# Patient Record
Sex: Female | Born: 1935 | Race: Black or African American | Hispanic: No | Marital: Married | Smoking: Never smoker
Health system: Southern US, Community
[De-identification: ages and names within clinical notes are randomized; demographics above are authoritative.]

## PROBLEM LIST (undated history)

## (undated) DIAGNOSIS — F0151 Vascular dementia with behavioral disturbance: Secondary | ICD-10-CM

## (undated) DIAGNOSIS — I2510 Atherosclerotic heart disease of native coronary artery without angina pectoris: Secondary | ICD-10-CM

## (undated) DIAGNOSIS — F32A Depression, unspecified: Secondary | ICD-10-CM

## (undated) DIAGNOSIS — G459 Transient cerebral ischemic attack, unspecified: Secondary | ICD-10-CM

## (undated) DIAGNOSIS — M1990 Unspecified osteoarthritis, unspecified site: Secondary | ICD-10-CM

## (undated) DIAGNOSIS — F0150 Vascular dementia without behavioral disturbance: Secondary | ICD-10-CM

## (undated) DIAGNOSIS — F0153 Vascular dementia, unspecified severity, with mood disturbance: Secondary | ICD-10-CM

## (undated) DIAGNOSIS — F329 Major depressive disorder, single episode, unspecified: Principal | ICD-10-CM

## (undated) DIAGNOSIS — E7800 Pure hypercholesterolemia, unspecified: Secondary | ICD-10-CM

## (undated) HISTORY — DX: Unspecified osteoarthritis, unspecified site: M19.90

## (undated) HISTORY — DX: Vascular dementia with behavioral disturbance: F01.51

## (undated) HISTORY — DX: Vascular dementia without behavioral disturbance: F01.50

## (undated) HISTORY — DX: Major depressive disorder, single episode, unspecified: F32.9

## (undated) HISTORY — DX: Depression, unspecified: F32.A

## (undated) HISTORY — PX: CORONARY ANGIOPLASTY WITH STENT PLACEMENT: SHX49

## (undated) HISTORY — DX: Vascular dementia, unspecified severity, with mood disturbance: F01.53

---

## 1997-06-10 ENCOUNTER — Ambulatory Visit (HOSPITAL_COMMUNITY): Admission: RE | Admit: 1997-06-10 | Discharge: 1997-06-10 | Payer: Self-pay | Admitting: Obstetrics & Gynecology

## 1997-06-12 ENCOUNTER — Other Ambulatory Visit: Admission: RE | Admit: 1997-06-12 | Discharge: 1997-06-12 | Payer: Self-pay | Admitting: *Deleted

## 1997-08-14 ENCOUNTER — Other Ambulatory Visit: Admission: RE | Admit: 1997-08-14 | Discharge: 1997-08-14 | Payer: Self-pay | Admitting: *Deleted

## 1998-08-13 ENCOUNTER — Other Ambulatory Visit: Admission: RE | Admit: 1998-08-13 | Discharge: 1998-08-13 | Payer: Self-pay | Admitting: *Deleted

## 1999-03-09 ENCOUNTER — Encounter: Payer: Self-pay | Admitting: Orthopedic Surgery

## 1999-03-15 ENCOUNTER — Encounter: Payer: Self-pay | Admitting: Orthopedic Surgery

## 1999-03-15 ENCOUNTER — Inpatient Hospital Stay (HOSPITAL_COMMUNITY): Admission: RE | Admit: 1999-03-15 | Discharge: 1999-03-19 | Payer: Self-pay | Admitting: Orthopedic Surgery

## 1999-03-16 ENCOUNTER — Encounter: Payer: Self-pay | Admitting: Orthopedic Surgery

## 1999-03-17 ENCOUNTER — Encounter: Payer: Self-pay | Admitting: Orthopedic Surgery

## 1999-08-10 ENCOUNTER — Other Ambulatory Visit: Admission: RE | Admit: 1999-08-10 | Discharge: 1999-08-10 | Payer: Self-pay | Admitting: *Deleted

## 2000-03-03 ENCOUNTER — Encounter: Payer: Self-pay | Admitting: Cardiology

## 2000-03-03 ENCOUNTER — Encounter: Admission: RE | Admit: 2000-03-03 | Discharge: 2000-03-03 | Payer: Self-pay | Admitting: Cardiology

## 2000-03-08 ENCOUNTER — Encounter: Payer: Self-pay | Admitting: Cardiology

## 2000-03-08 ENCOUNTER — Ambulatory Visit (HOSPITAL_COMMUNITY): Admission: RE | Admit: 2000-03-08 | Discharge: 2000-03-08 | Payer: Self-pay | Admitting: Cardiology

## 2000-03-22 ENCOUNTER — Encounter: Payer: Self-pay | Admitting: Cardiology

## 2000-03-22 ENCOUNTER — Ambulatory Visit (HOSPITAL_COMMUNITY): Admission: RE | Admit: 2000-03-22 | Discharge: 2000-03-22 | Payer: Self-pay | Admitting: Cardiology

## 2000-04-05 ENCOUNTER — Ambulatory Visit (HOSPITAL_COMMUNITY): Admission: RE | Admit: 2000-04-05 | Discharge: 2000-04-05 | Payer: Self-pay | Admitting: Cardiology

## 2000-04-05 ENCOUNTER — Encounter: Payer: Self-pay | Admitting: Cardiology

## 2000-08-10 ENCOUNTER — Other Ambulatory Visit: Admission: RE | Admit: 2000-08-10 | Discharge: 2000-08-10 | Payer: Self-pay | Admitting: *Deleted

## 2000-11-08 ENCOUNTER — Encounter: Payer: Self-pay | Admitting: Cardiology

## 2000-11-08 ENCOUNTER — Encounter: Admission: RE | Admit: 2000-11-08 | Discharge: 2000-11-08 | Payer: Self-pay | Admitting: *Deleted

## 2000-11-22 ENCOUNTER — Encounter: Admission: RE | Admit: 2000-11-22 | Discharge: 2000-11-22 | Payer: Self-pay | Admitting: Cardiology

## 2000-11-22 ENCOUNTER — Encounter: Payer: Self-pay | Admitting: Cardiology

## 2000-12-06 ENCOUNTER — Encounter: Admission: RE | Admit: 2000-12-06 | Discharge: 2000-12-06 | Payer: Self-pay | Admitting: Cardiology

## 2000-12-06 ENCOUNTER — Encounter: Payer: Self-pay | Admitting: Cardiology

## 2001-08-08 ENCOUNTER — Other Ambulatory Visit: Admission: RE | Admit: 2001-08-08 | Discharge: 2001-08-08 | Payer: Self-pay | Admitting: *Deleted

## 2001-08-23 ENCOUNTER — Encounter: Payer: Self-pay | Admitting: Emergency Medicine

## 2001-08-23 ENCOUNTER — Emergency Department (HOSPITAL_COMMUNITY): Admission: EM | Admit: 2001-08-23 | Discharge: 2001-08-23 | Payer: Self-pay | Admitting: Emergency Medicine

## 2002-02-01 ENCOUNTER — Encounter: Payer: Self-pay | Admitting: Cardiology

## 2002-02-01 ENCOUNTER — Encounter: Admission: RE | Admit: 2002-02-01 | Discharge: 2002-02-01 | Payer: Self-pay | Admitting: Cardiology

## 2002-02-13 ENCOUNTER — Encounter: Payer: Self-pay | Admitting: Cardiology

## 2002-02-13 ENCOUNTER — Encounter: Admission: RE | Admit: 2002-02-13 | Discharge: 2002-02-13 | Payer: Self-pay | Admitting: Cardiology

## 2002-02-18 ENCOUNTER — Encounter: Payer: Self-pay | Admitting: Cardiology

## 2002-02-18 ENCOUNTER — Encounter: Admission: RE | Admit: 2002-02-18 | Discharge: 2002-02-18 | Payer: Self-pay | Admitting: Cardiology

## 2002-03-04 ENCOUNTER — Encounter: Payer: Self-pay | Admitting: Cardiology

## 2002-03-04 ENCOUNTER — Encounter: Admission: RE | Admit: 2002-03-04 | Discharge: 2002-03-04 | Payer: Self-pay | Admitting: Cardiology

## 2002-06-11 ENCOUNTER — Ambulatory Visit (HOSPITAL_COMMUNITY): Admission: RE | Admit: 2002-06-11 | Discharge: 2002-06-11 | Payer: Self-pay | Admitting: Cardiology

## 2002-08-13 ENCOUNTER — Other Ambulatory Visit: Admission: RE | Admit: 2002-08-13 | Discharge: 2002-08-13 | Payer: Self-pay | Admitting: *Deleted

## 2002-08-25 ENCOUNTER — Emergency Department (HOSPITAL_COMMUNITY): Admission: EM | Admit: 2002-08-25 | Discharge: 2002-08-25 | Payer: Self-pay | Admitting: Emergency Medicine

## 2002-08-25 ENCOUNTER — Encounter: Payer: Self-pay | Admitting: Emergency Medicine

## 2002-09-05 ENCOUNTER — Encounter: Payer: Self-pay | Admitting: Cardiology

## 2002-09-05 ENCOUNTER — Encounter: Admission: RE | Admit: 2002-09-05 | Discharge: 2002-09-05 | Payer: Self-pay | Admitting: Cardiology

## 2002-09-05 ENCOUNTER — Encounter: Payer: Self-pay | Admitting: Diagnostic Radiology

## 2002-09-11 ENCOUNTER — Encounter: Admission: RE | Admit: 2002-09-11 | Discharge: 2002-09-11 | Payer: Self-pay | Admitting: Cardiology

## 2002-09-11 ENCOUNTER — Encounter: Payer: Self-pay | Admitting: Cardiology

## 2002-09-24 ENCOUNTER — Encounter: Payer: Self-pay | Admitting: Cardiology

## 2002-09-24 ENCOUNTER — Encounter: Admission: RE | Admit: 2002-09-24 | Discharge: 2002-09-24 | Payer: Self-pay | Admitting: Cardiology

## 2002-09-27 ENCOUNTER — Encounter (INDEPENDENT_AMBULATORY_CARE_PROVIDER_SITE_OTHER): Payer: Self-pay | Admitting: Specialist

## 2002-09-27 ENCOUNTER — Ambulatory Visit (HOSPITAL_COMMUNITY): Admission: RE | Admit: 2002-09-27 | Discharge: 2002-09-28 | Payer: Self-pay | Admitting: Interventional Radiology

## 2002-10-31 ENCOUNTER — Encounter: Admission: RE | Admit: 2002-10-31 | Discharge: 2002-10-31 | Payer: Self-pay | Admitting: Cardiology

## 2002-10-31 ENCOUNTER — Encounter: Payer: Self-pay | Admitting: Cardiology

## 2002-11-29 ENCOUNTER — Encounter: Payer: Self-pay | Admitting: Cardiology

## 2002-11-29 ENCOUNTER — Encounter: Admission: RE | Admit: 2002-11-29 | Discharge: 2002-11-29 | Payer: Self-pay | Admitting: Cardiology

## 2003-03-20 ENCOUNTER — Ambulatory Visit (HOSPITAL_COMMUNITY): Admission: RE | Admit: 2003-03-20 | Discharge: 2003-03-20 | Payer: Self-pay | Admitting: Neurological Surgery

## 2004-01-07 ENCOUNTER — Encounter: Admission: RE | Admit: 2004-01-07 | Discharge: 2004-01-07 | Payer: Self-pay | Admitting: Cardiology

## 2004-10-11 ENCOUNTER — Encounter: Admission: RE | Admit: 2004-10-11 | Discharge: 2004-10-11 | Payer: Self-pay | Admitting: Cardiology

## 2005-04-01 IMAGING — XA IR KYPHOPLASTY/VERTERBROPLASTY
2 series · 13 of 13 positions shown · non-contrast
Comparison: none

FINDINGS
CLINICAL DATA: PATIENT WITH SEVERE LOW BACK PAIN SECONDARY TO OSTEOPOROTIC COMPRESSION FRACTURES
AT T12 AND AT L4.
KYPHOPLASTY AT T12 AND VERTEBROPLASTY AT T12
FOLLOWING A FULL EXPLANATION OF THE PROCEDURES ALONG WITH THE POTENTIALLY ASSOCIATED COMPLICATIONS,
AN INFORMED WITNESSED CONSENT WAS OBTAINED FROM THE PATIENT.
KYPHOPLASTY AT T12
THE PATIENT WAS PLACED PRONE ON THE FLUOROSCOPIC TABLE.  THE SKIN OVER THE THORACOLUMBAR REGION WAS
PREPPED AND DRAPED IN THE USUAL STERILE FASHION.  THE T12 VERTEBRAL BODY WAS THEN BROUGHT INTO
OPTIMAL VIEW.  THE RIGHT PEDICLE WAS IDENTIFIED.  THE SKIN OVERLYING THIS WAS INFILTRATED WITH
PERCENT BUPIVACAINE EXTENDING TO THE RIGHT PEDICLE.
USING BIPLANE INTERMITTENT FLUOROSCOPY, AN 11 GAUGE SAW SPINAL NEEDLE WAS THEN ADVANCED
THROUGH THE RIGHT PEDICLE AND POSITIONED WITH ITS TIP JUST INSIDE THE  POSTERIOR ASPECT OF T12.
OVER A KYPHX OSTEO BONE PIN, THIS WAS THEN EXCHANGED FOR THE KYPHX ADVANCED OSTEO INTRODUCER SYSTEM
COMPRISED OF A WORKING CANNULA AND THE OSTEO DRILL.
THESE TWO WERE  ADVANCED IN A MEDIAL TRAJECTORY UNTIL THE OSTEO DRILL WAS IN THE POSTERIOR THIRD OF
T12.  THE BONE PIN WAS REMOVED.  THE ADVANCEMENT THEN PROCEEDED IN A MEDIAL TRAJECTORY UNTIL THE
TIP OF THE WORKING CANNULA WAS IN THE POSTERIOR ASPECT OF T12.  THE OSTEO DRILL WAS THEN REMOVED
AND A CORE SAMPLE SENT FOR PATHOLOGIC ANALYSIS.
THROUGH THE WORKING CANNULA, A KYPHX BONE BIOPSY DEVICE WAS THEN ADVANCED TO WITHIN 5 MM OF THE
ANTERIOR ASPECT OF T12.  A CORE SAMPLE FROM THIS WAS ALSO SENT FOR PATHOLOGIC ANALYSIS.
AN INFLATABLE BONE TAMP 15 X 3 WAS THEN ADVANCED THROUGH THE WORKING CANNULA AND EXPANDED WITH
CC OF CONTRAST USING THE A KYPHX INFLATION SYRINGE DEVICE.
IN A SIMILAR FASHION, THE LEFT PEDICLE WAS THEN ACCESSED WITH AN 11 GAUGE JAMSHIDI NEEDLE FOLLOWING
THE APPLICATION OF LOCAL ANESTHETIC OF 0.25 PERCENT BUPIVACAINE.  OVER A KYPHX OSTEO BONE PIN, THIS
WAS THEN AGAIN EXCHANGED FOR THE KYPHX ADVANCED OSTEO INTRODUCER SYSTEM COMPRISED OF THE WORKING
CANNULA AND THE OSTEO DRILL.  THE COMBINATION OF THESE TWO WERE THEN ADVANCED OVER THE KYPHX OSTEO
BONE PIN UNTIL THE DRILL WAS IN THE POSTERIOR THIRD OF T12.  THE BONE PIN WAS THEN REMOVED.  IN A
MEDIAL TRAJECTORY, THE  COMBINATION OF THE WORKING CANNULA AND THE OSTEO DRILL WERE ADVANCED UNTIL
THE TIP OF THE WORKING CANNULA WAS JUST INSIDE THE POSTERIOR ASPECT OF T12.  THE OSTEO DRILL WAS
REMOVED AND A CORE SAMPLE SENT FOR PATHOLOGIC ANALYSIS.
INFERIOR ASPECT OF TI2.    A CORE SAMPLE FROM THIS WAS ALSO SENT FOR PATHOLOGIC ANALYSIS.
A KYPHX EXPANDER AND INFLATABLE BONE TAMP 15 X 3 WAS THEN ADVANCED WITH ITS DISTAL MARKER POSITION
5 MM FROM THE INFERIOR ASPECT OF T12.  SUBSEQUENTLY, BOTH BALLOONS WERE THEN INFLATED UNTIL THERE
WAS  REDUCTION AND LIFTING OF THE SUPERIOR AND INFERIOR END PLATE FRACTURES AND ALSO OF THE
ANTERIOR COLUMN.  ONCE THIS WAS ACHIEVED, THE METHYL METHACRYLATE MIXTURE WAS RECONSTITUTED WITH
POWDERED BARIUM SULFATE AND TOBRAMYCIN IN THE KYPHX BONE MIXING DEVICE SYSTEM.
THE BONE FILLERS WERE THEN FILLED WITH THE METHYL METHACRYLATE MIXTURE.  THE LEFT BALLOON WAS
DEFLATED FIRST FOLLOWED BY THE INSTILLATION OF 2 BONE FILLER EQUIVALENTS OF METHYL METHACRYLATE
MIXTURE.  IN A SIMILAR FASHION, THE RIGHT BALLOON WAS DEFLATED NEXT FOLLOWED BY THE INSTILLATION OF
2 BONE FILLER EQUIVALENTS OF METHYL METHACRYLATE MIXTURE.  THERE WAS NO EXTRAVASATION INTO THE DISC
SPACES OR POSTERIORLY INTO THE SPINAL CANAL.  METHYL METHACRYLATE MIXTURE WAS SEEN TO COVER THE
INFERIOR END PLATE DEFECT SEEN ON THE MRI EXAMINATION.
THE BONE FILLERS WERE THEN RETRIEVED INTO THE WORKING CANNULA AND THESE TWO WERE REMOVED.
HEMOSTASIS WAS ACHIEVED.
MEDICATIONS UTILIZED:  VERSED 2 MG AND FENTANYL 50 MICROGRAMS IV FOR SEDATION.
_
IMPRESSION
1.  STATUS POST FLUOROSCOPIC NEEDLE PLACEMENT FOR DEEP CORE BONE BIOPSY AT T12.
2.  STATUS POST CLOSED REDUCTION AND INTERNAL FIXATION OF SUPERIOR AND INFERIOR END PLATE FRACTURES
INVOLVING THE T12 VERTEBRAL BODY WITHOUT EVENT.
VERTEBROPLASTY AT L4
THE L4 VERTEBRAL BODY WAS THEN BROUGHT INTO OPTIMAL VIEW.
THE RIGHT PEDICLE WAS IDENTIFIED.  THE SKIN OVERLYING THIS WAS INFILTRATED WITH 0.25 PERCENT
BUPIVACAINE AND EXTENDED INTO THE RIGHT PEDICLE.    USING BIPLANE INTERMITTENT FLUOROSCOPY, AN 11
GAUGE JAMSHIDI NEEDLE WAS THEN ADVANCED THROUGH THE RIGHT PEDICLE AND POSITIONED IN THE ANTERIOR
ONE THIRD OF L4.  A GENTLE CONTRAST INJECTION DEMONSTRATED TRABECULAR PATTERN OF CONTRAST WITH
DELAYED OPACIFICATION OF THE EPIDURAL VENOUS STRUCTURES.  NO CROSSING OF THE MIDLINE WAS NOTED.
THIS PROMPTED ACCESS THROUGH THE LEFT PEDICLE AT L4.  FOLLOWING THE LOCAL APPLICATION OF
PERCENT BUPIVACAINE, A 13 GAUGE YED SPINAL NEEDLE WAS THEN ADVANCED TO THE LEFT PEDICLE USING
BIPLANE INTERMITTENT FLUOROSCOPY.  THE NEEDLE WAS ADVANCED TO WITHIN ONE THIRD OF THE ANTERIOR
ASPECT OF L4.  A GENTLE CONTRAST INJECTION DEMONSTRATED A TRABECULAR PATTERN OF CONTRAST WITH
DELAYED OPACIFICATION OF THE LATERAL EPIDURAL VEINS ON THE LEFT.
METHYL METHACRYLATE MIXTURE WAS RECONSTITUTED WITH POWDERED BARIUM SULFATE AND TOBRAMYCIN IN AN
ENCLOSED YADORI DELIVERY DEVICE SYSTEM.
USING BIPLANE INTERMITTED FLUOROSCOPY,  THE METHYL METHACRYLATE MIXTURE WAS INSTILLED INTO THE L4
VERTEBRAL BODY VIA BOTH TRANSPEDICULAR APPROACHES WITH EXCELLENT FILLING IN THE AP AND THE
CRANIOCAUDAL PROJECTIONS.  THERE WAS A MINISCULE AMOUNT OF METHYL METHACRYLATE MIXTURE NOTED IN A
LEFT LATERAL EPIDURAL VEIN ON THE LEFT.  THERE WAS NO EXTRAVASATION INTO DISC SPACES OR POSTERIORLY
INTO THE SPINAL CANAL.
THE PATIENT TOLERATED THE PROCEDURE WELL.  THERE WERE NO IMMEDIATE COMPLICATIONS.
MEDICATIONS UTILIZED:   VERSED [DATE] MG IV.  FENTANYL 12.5 MICROGRAMS IV.
STATUS POST FLUOROSCOPIC-GUIDED VERTEBROPLASTY FOR PAINFUL OSTEOPOROTIC COMPRESSION FRACTURES AT L4
 AS DESCRIBED ABOVE WITHOUT EVENT.
          [REDACTED]
          6216 [REDACTED]
          [HOSPITAL][HOSPITAL] [id number]

[Series 1: run · 12 of 12 slices shown (1 of 2)]
[im 1/12]
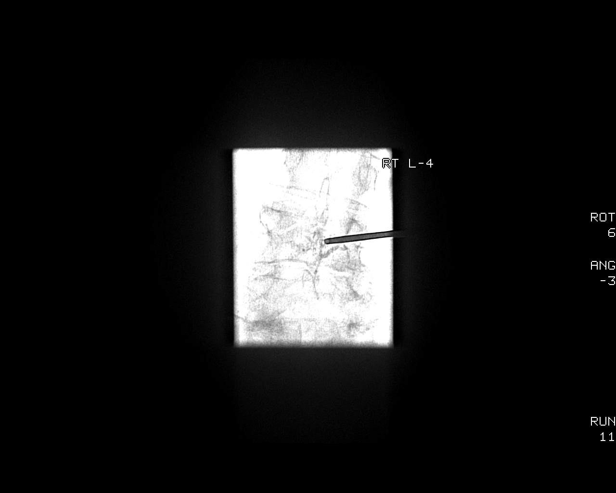
[im 2/12]
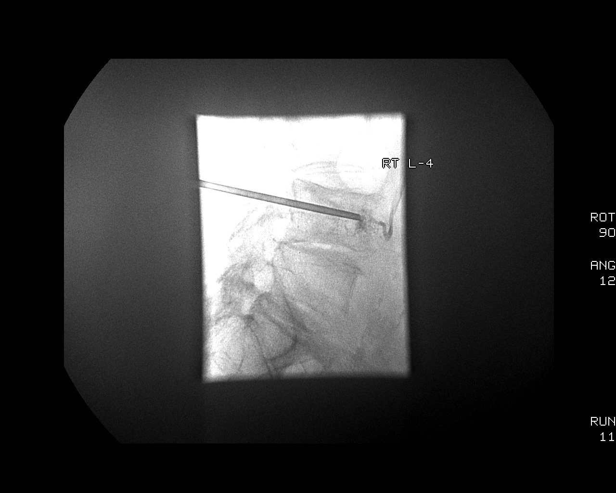
[im 3/12]
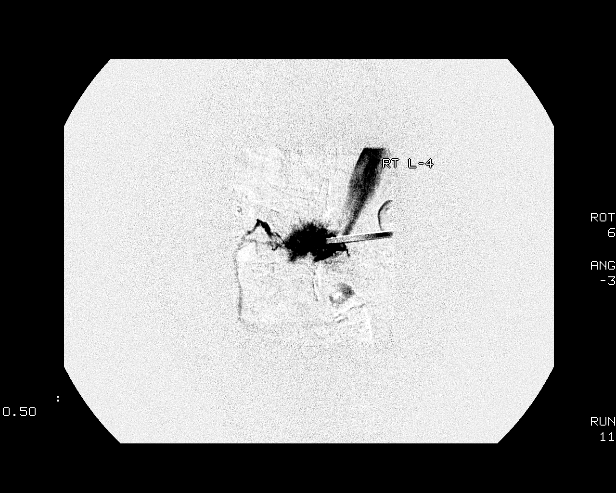
[im 4/12]
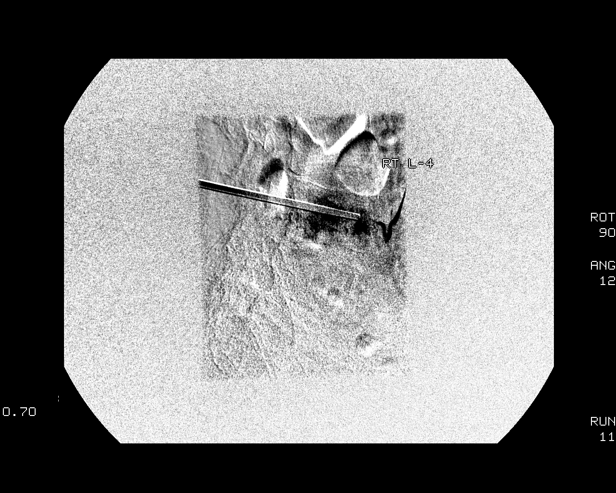
[im 5/12]
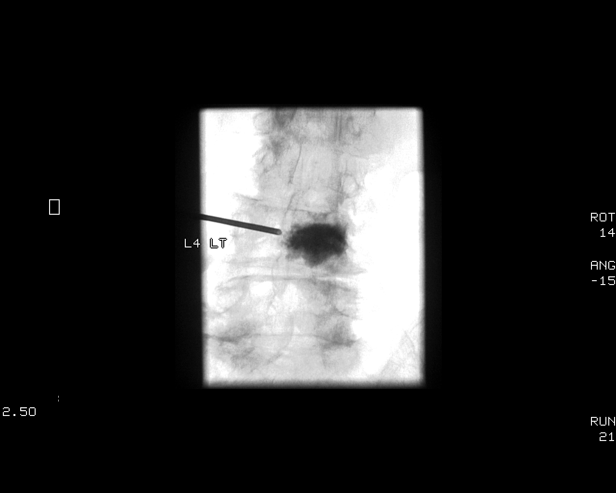
[im 6/12]
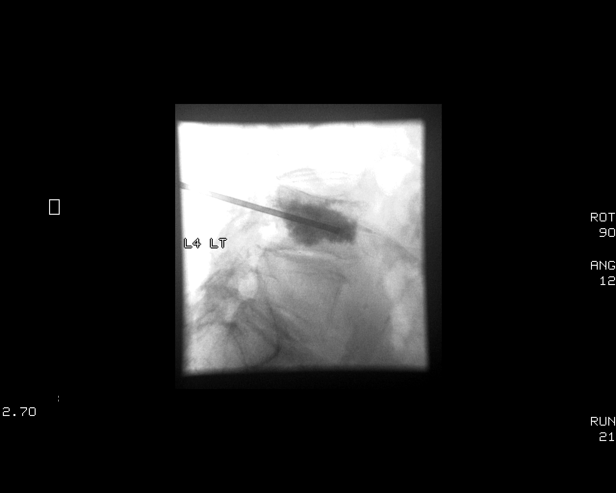
[im 7/12]
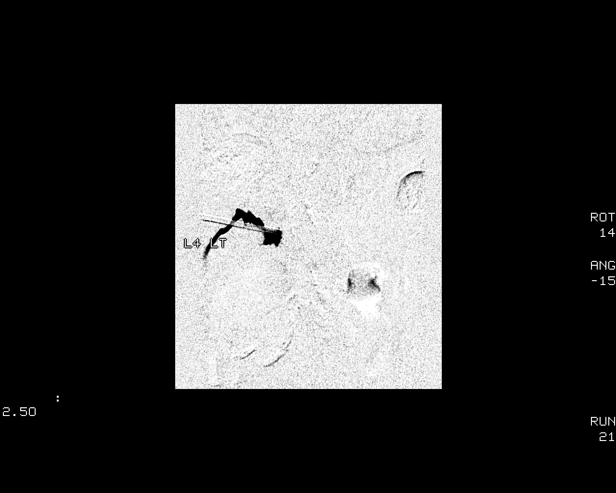
[im 8/12]
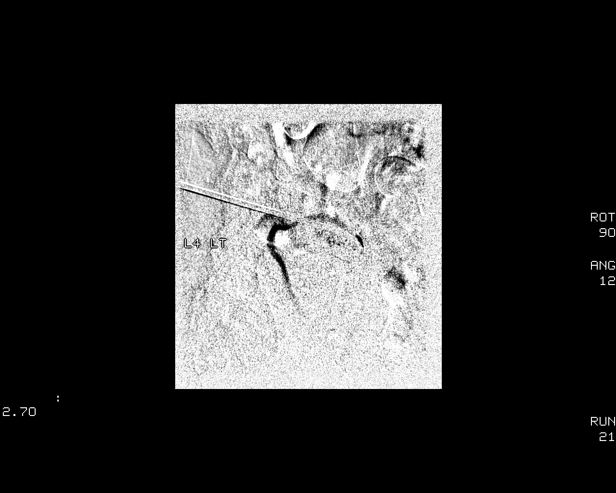
[im 9/12]
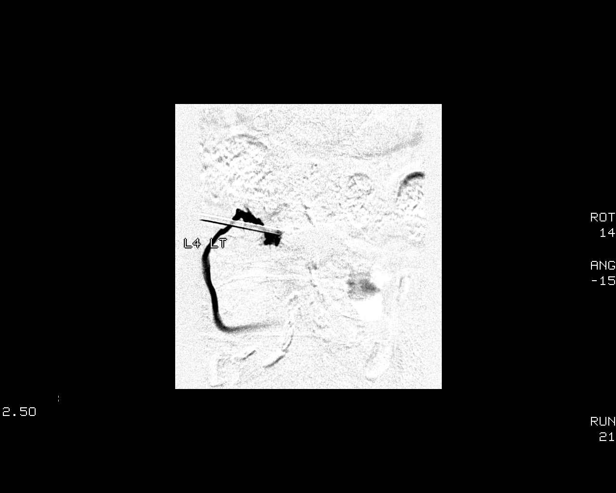
[im 10/12]
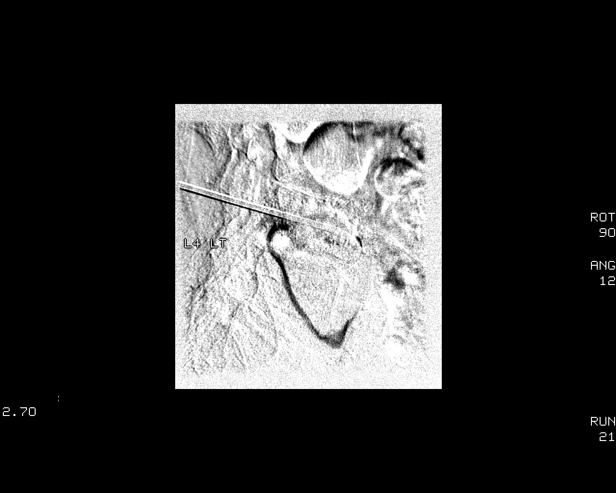
[im 11/12]
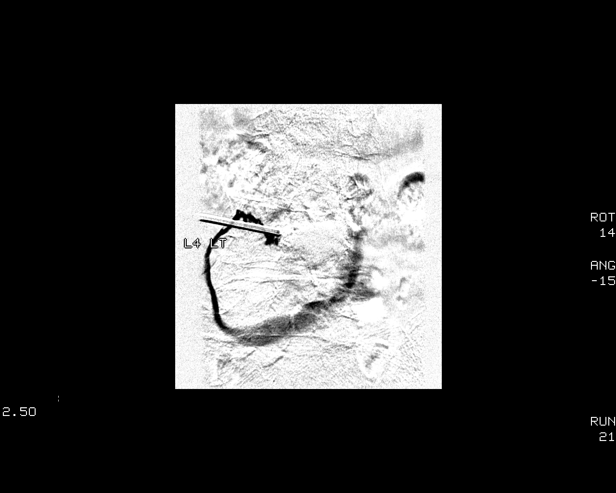
[im 12/12]
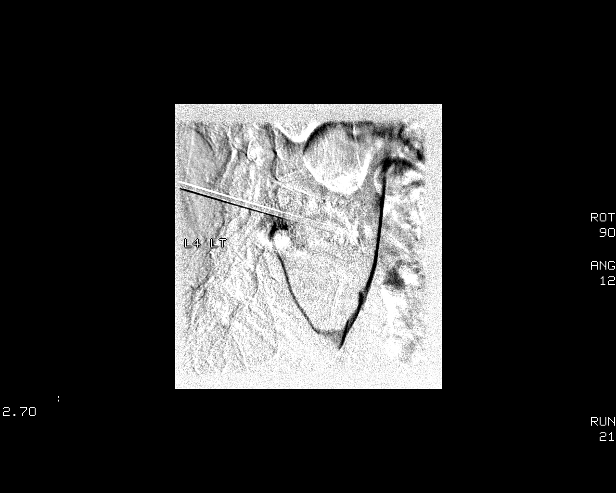

[Series 29: run · 1 of 1 slices shown (2 of 2)]
[im 1/1]
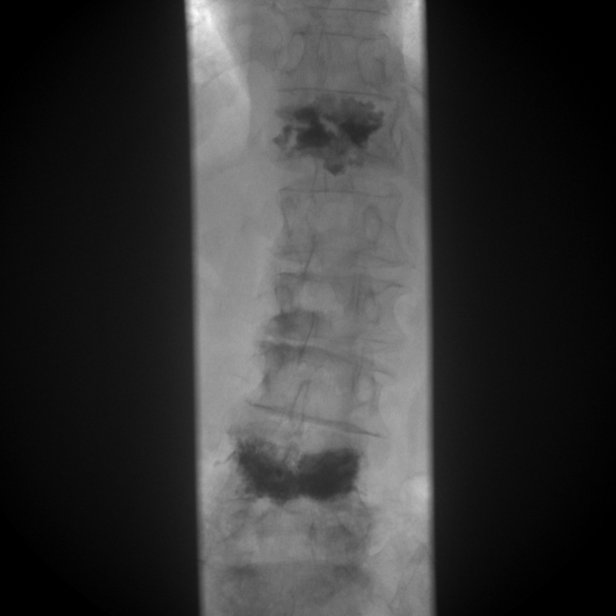

[13 of 13 positions shown; findings below may reference images not displayed]

## 2005-12-07 ENCOUNTER — Emergency Department (HOSPITAL_COMMUNITY): Admission: EM | Admit: 2005-12-07 | Discharge: 2005-12-07 | Payer: Self-pay | Admitting: Emergency Medicine

## 2006-08-19 ENCOUNTER — Emergency Department (HOSPITAL_COMMUNITY): Admission: EM | Admit: 2006-08-19 | Discharge: 2006-08-19 | Payer: Self-pay | Admitting: Emergency Medicine

## 2006-08-23 ENCOUNTER — Encounter: Admission: RE | Admit: 2006-08-23 | Discharge: 2006-08-23 | Payer: Self-pay | Admitting: Cardiology

## 2007-05-24 ENCOUNTER — Emergency Department (HOSPITAL_COMMUNITY): Admission: EM | Admit: 2007-05-24 | Discharge: 2007-05-24 | Payer: Self-pay | Admitting: Emergency Medicine

## 2008-04-25 ENCOUNTER — Encounter: Admission: RE | Admit: 2008-04-25 | Discharge: 2008-04-25 | Payer: Self-pay | Admitting: Cardiology

## 2008-05-07 ENCOUNTER — Encounter (HOSPITAL_COMMUNITY): Admission: RE | Admit: 2008-05-07 | Discharge: 2008-08-05 | Payer: Self-pay | Admitting: Cardiology

## 2008-05-15 ENCOUNTER — Inpatient Hospital Stay (HOSPITAL_COMMUNITY): Admission: AD | Admit: 2008-05-15 | Discharge: 2008-05-16 | Payer: Self-pay | Admitting: Cardiology

## 2008-05-15 ENCOUNTER — Ambulatory Visit: Payer: Self-pay | Admitting: Thoracic Surgery (Cardiothoracic Vascular Surgery)

## 2008-05-28 ENCOUNTER — Inpatient Hospital Stay (HOSPITAL_COMMUNITY): Admission: AD | Admit: 2008-05-28 | Discharge: 2008-05-30 | Payer: Self-pay | Admitting: Cardiology

## 2008-10-31 ENCOUNTER — Encounter: Admission: RE | Admit: 2008-10-31 | Discharge: 2008-10-31 | Payer: Self-pay | Admitting: Cardiology

## 2009-04-06 ENCOUNTER — Inpatient Hospital Stay (HOSPITAL_COMMUNITY): Admission: AD | Admit: 2009-04-06 | Discharge: 2009-04-08 | Payer: Self-pay | Admitting: Cardiology

## 2009-08-26 ENCOUNTER — Encounter: Admission: RE | Admit: 2009-08-26 | Discharge: 2009-08-26 | Payer: Self-pay | Admitting: Cardiology

## 2010-03-21 ENCOUNTER — Encounter: Payer: Self-pay | Admitting: Cardiology

## 2010-03-30 ENCOUNTER — Ambulatory Visit (HOSPITAL_COMMUNITY)
Admission: RE | Admit: 2010-03-30 | Discharge: 2010-03-30 | Disposition: A | Discharge status: Home / Self Care | Payer: Self-pay | Attending: Cardiology | Admitting: Cardiology

## 2010-04-14 NOTE — Procedures (Signed)
NAME:  Angela Mcintosh, Angela Mcintosh            ACCOUNT NO.:  [id number]  MEDICAL RECORD NO.:  [id number]          PATIENT TYPE:  OIB  LOCATION:  2899                         FACILITY:  [facility]  PHYSICIAN:  Eduardo Osier. Sharyn Lull, M.D. DATE OF BIRTH:  1918/07/11  DATE OF PROCEDURE:  03/30/2010 DATE OF DISCHARGE:  03/30/2010                           CARDIAC CATHETERIZATION   PROCEDURES:  Left cardiac catheterization with selective left and right coronary angiography, left ventriculography via right groin using Judkins technique.  INDICATIONS FOR PROCEDURE:  [name] is an 77 year old black female with past medical history significant for multivessel coronary artery disease, status post PCI to proximal LAD and proximal left circumflex in the past, hypertension, hypercholesteremia, GERD, degenerative joint disease, dementia, complains of retrosternal chest pain described as pressure associated with nausea, relieves with sublingual nitro off and on.  Per daughter, mother has to take three sublingual nitro yesterday to relieve the chest pain.  Denies any shortness of breath.  Denies palpitation, lightheadedness, or syncope.  Denies PND, orthopnea, or leg swelling.  Denies chest pain at present.  Denies relation of chest pain to food, breathing or movement.  PAST MEDICAL HISTORY:  As above.  PAST SURGICAL HISTORY:  She had back surgery in the past, had knee surgery in the past.  ALLERGIES:  No known drug allergies.  SOCIAL HISTORY:  She is married, has two daughters, retired Runner, broadcasting/film/video.  No history of smoking or alcohol abuse.  MEDICATIONS AT HOME:  She is on; 1. Enteric-coated aspirin 81 mg p.o. daily. 2. Plavix 75 mg p.o. daily. 3. Nitro-Dur patch has been increased from 0.2-0.4 mg per hour daily. 4. Toprol-XL 25 mg p.o. daily. 5. Crestor 10 mg p.o. daily. 6. Pepcid 20 mg p.o. b.i.d. 7. Calcium one tablet daily. 8. Feosol one tablet daily. 9. Nitrostat sublingual as needed.  FAMILY  HISTORY:  Noncontributory.  PHYSICAL EXAMINATION:  GENERAL:  She is alert, awake and oriented x3 in no acute distress. VITAL SIGNS:  Blood pressure was 130/80, pulse was 72 and regular. HEENT:  Conjunctivae was pink. NECK:  Supple.  No JVD.  No bruit. LUNGS:  Clear to auscultation without rhonchi or rales. CARDIOVASCULAR:  S1-S2 was normal.  There was soft systolic murmur. ABDOMEN:  Soft.  Bowel sounds were present, nontender. EXTREMITIES:  There was no clubbing, cyanosis or edema.  IMPRESSION:  Accelerated angina to  rule out restenosis versus progression of disease, coronary artery disease, history of percutaneous coronary intervention to left anterior descending artery and left circumflex in the past, hypertension, hypercholesteremia, gastroesophageal reflux disease, degenerative joint disease, dementia. Discussed with the patient and her daughter regarding left catheterization, possible percutaneous transluminal coronary angioplasty and stenting, its risks and benefits, i.e. death, MI, stroke, need for emergency coronary artery bypass graft, local vascular complications etc., versus medical management and consented for percutaneous coronary intervention.  PROCEDURE:  After obtaining the informed consent, the patient was brought to the Cath Lab and was on fluoroscopy table.  Right groin was prepped and draped in usual fashion.  Xylocaine 1% was used for local anesthesia in the right groin.  With the help of thin-wall needle, a 5- [place] arterial  sheath was placed.  The sheath was aspirated and flushed.  Next, a 5-French left Judkins catheter was advanced over the wire under fluoroscopic guidance up to the ascending aorta.  Wire was pulled out, the catheter was aspirated and connected to the manifold. Catheter was further advanced and engaged into left coronary ostium. Multiple views of the left system were taken.  Next, the catheter was disengaged and was pulled out over the wire  and was replaced with 5- [place] right Judkins catheter, which was advanced over the wire under fluoroscopic guidance up to the ascending aorta.  Wire was pulled out, the catheter was aspirated and connected to the manifold.  Catheter was further advanced and attempted to engaged into right coronary ostium without success.  This catheter was exchanged to IM catheter, which was advanced over the wire under fluoroscopic guidance up to the ascending aorta.  Wire was pulled out, the catheter was aspirated and connected to the manifold.  Catheter was further advanced and engaged into right coronary ostium.  Multiple views of the right system were taken.  Next, the catheter was disengaged and was pulled out over the wire and was replaced with 5-French pigtail catheter, which was advanced over the wire under fluoroscopic guidance up to the ascending aorta.  Wire was pulled out, the catheter was aspirated and connected to the manifold. Catheter was further advanced across the aortic valve into the LV.  LV pressures were recorded.  Next, left ventriculography was done in 30- degree RAO position.  Post-angiographic pressures were recorded from LV and then pullback pressures were recorded from aorta.  There was no gradient across the aortic valve.  Next, the pigtail catheter was pulled out over the wire, sheaths were aspirated and flushed.  FINDINGS:  LV showed good LV systolic function, EF of 60-65%.  Left main has 30-40% ostial and proximal stenosis.  LAD has 5-10% in-stent proximal restenosis and 15-20% focal proximal stenosis beyond the stent and also has 50-60% mid bifurcation stenosis with diagonal 2.  Diagonal 1 is very very small, diagonal 2 is very small.  Septal 1 is large which has 50-60% ostial stenosis.  Left circumflex is patent at prior PTCA stented site proximally and has 20-25% mid focal stenosis.  OM-1 is very small.  OM-2 is small, which is patent.  RCA has 60-65% proximal  smooth stenosis as before with TIMI grade 3 distal flow.  PDA has 40-50% proximal stenosis.  PLV branch is small, which is patent.  The patient tolerated the procedure well.  There were no complications.  Plan is to maximize the antianginal medications.  If she gets recurrent progressive chest pain despite increasing the medications, we will consider PCI to proximal RCA.     Eduardo Osier. Sharyn Lull, M.D.     MNH/MEDQ  D:  03/30/2010  T:  03/30/2010  Job:  [id number]  cc:   Osvaldo Shipper. Spruill, M.D. Cath Lab  Electronically Signed by Rinaldo Cloud M.D. on 04/14/2010 11:10:31 PM

## 2010-05-19 LAB — LIPID PANEL
HDL: 76 mg/dL (ref >39)
LDL Cholesterol: 94 mg/dL (ref 0–99)
Triglycerides: 57 mg/dL (ref <150)
VLDL: 11 mg/dL (ref 0–40)

## 2010-05-19 LAB — BASIC METABOLIC PANEL
BUN: 4 mg/dL — ABNORMAL LOW (ref 6–23)
CO2: 25 mEq/L (ref 19–32)
Calcium: 8.1 mg/dL — ABNORMAL LOW (ref 8.4–10.5)
Creatinine, Ser: 0.7 mg/dL (ref 0.4–1.2)
GFR calc Af Amer: >60 mL/min (ref >60)

## 2010-05-19 LAB — CARDIAC PANEL(CRET KIN+CKTOT+MB+TROPI)
CK, MB: 1.1 ng/mL (ref 0.3–4.0)
Relative Index: INVALID (ref 0.0–2.5)
Relative Index: INVALID (ref 0.0–2.5)
Troponin I: 0.01 ng/mL (ref 0.00–0.06)
Troponin I: 0.02 ng/mL (ref 0.00–0.06)

## 2010-05-19 LAB — HEPARIN LEVEL (UNFRACTIONATED): Heparin Unfractionated: 0.76 IU/mL — ABNORMAL HIGH (ref 0.30–0.70)

## 2010-05-19 LAB — PROTIME-INR
INR: 1.06 (ref 0.00–1.49)
Prothrombin Time: 13.7 seconds (ref 11.6–15.2)

## 2010-05-19 LAB — COMPREHENSIVE METABOLIC PANEL
AST: 25 U/L (ref 0–37)
BUN: 13 mg/dL (ref 6–23)
CO2: 26 mEq/L (ref 19–32)
Calcium: 8.4 mg/dL (ref 8.4–10.5)
Creatinine, Ser: 0.82 mg/dL (ref 0.4–1.2)
GFR calc Af Amer: >60 mL/min (ref >60)
GFR calc non Af Amer: >60 mL/min (ref >60)
Total Bilirubin: 0.4 mg/dL (ref 0.3–1.2)

## 2010-05-19 LAB — CBC
HCT: 33.8 % — ABNORMAL LOW (ref 36.0–46.0)
MCHC: 33.4 g/dL (ref 30.0–36.0)
MCHC: 33.5 g/dL (ref 30.0–36.0)
MCV: 92 fL (ref 78.0–100.0)
MCV: 92.6 fL (ref 78.0–100.0)
MCV: 92.7 fL (ref 78.0–100.0)
Platelets: 171 10*3/uL (ref 150–400)
Platelets: 185 10*3/uL (ref 150–400)
Platelets: 192 10*3/uL (ref 150–400)
RBC: 3.6 MIL/uL — ABNORMAL LOW (ref 3.87–5.11)
RBC: 3.65 MIL/uL — ABNORMAL LOW (ref 3.87–5.11)
RDW: 14.2 % (ref 11.5–15.5)
RDW: 14.4 % (ref 11.5–15.5)
WBC: 5.6 10*3/uL (ref 4.0–10.5)

## 2010-05-19 LAB — CK TOTAL AND CKMB (NOT AT ARMC): Relative Index: INVALID (ref 0.0–2.5)

## 2010-06-09 LAB — CK TOTAL AND CKMB (NOT AT ARMC)
CK, MB: 1.1 ng/mL (ref 0.3–4.0)
Relative Index: INVALID (ref 0.0–2.5)
Relative Index: INVALID (ref 0.0–2.5)
Total CK: 59 U/L (ref 7–177)
Total CK: 68 U/L (ref 7–177)

## 2010-06-09 LAB — BASIC METABOLIC PANEL
BUN: 9 mg/dL (ref 6–23)
CO2: 24 mEq/L (ref 19–32)
Chloride: 108 mEq/L (ref 96–112)
GFR calc non Af Amer: >60 mL/min (ref >60)
Glucose, Bld: 96 mg/dL (ref 70–99)
Potassium: 3.5 mEq/L (ref 3.5–5.1)
Sodium: 141 mEq/L (ref 135–145)

## 2010-06-09 LAB — CBC
HCT: 36.1 % (ref 36.0–46.0)
Hemoglobin: 12.2 g/dL (ref 12.0–15.0)
MCV: 93.5 fL (ref 78.0–100.0)
Platelets: 227 10*3/uL (ref 150–400)
RDW: 13.4 % (ref 11.5–15.5)

## 2010-06-09 LAB — LIPID PANEL
Cholesterol: 150 mg/dL (ref 0–200)
HDL: 42 mg/dL (ref >39)
Total CHOL/HDL Ratio: 3.6 RATIO
VLDL: 12 mg/dL (ref 0–40)

## 2010-06-10 LAB — LIPID PANEL
HDL: 63 mg/dL (ref >39)
Total CHOL/HDL Ratio: 3.4 RATIO
VLDL: 23 mg/dL (ref 0–40)

## 2010-06-10 LAB — CBC
HCT: 34.3 % — ABNORMAL LOW (ref 36.0–46.0)
HCT: 36.8 % (ref 36.0–46.0)
Hemoglobin: 12.4 g/dL (ref 12.0–15.0)
MCV: 93.7 fL (ref 78.0–100.0)
Platelets: 239 10*3/uL (ref 150–400)
RBC: 3.88 MIL/uL (ref 3.87–5.11)
RDW: 13.6 % (ref 11.5–15.5)
WBC: 5.6 10*3/uL (ref 4.0–10.5)

## 2010-06-10 LAB — COMPREHENSIVE METABOLIC PANEL
ALT: 12 U/L (ref 0–35)
Alkaline Phosphatase: 54 U/L (ref 39–117)
BUN: 15 mg/dL (ref 6–23)
CO2: 24 mEq/L (ref 19–32)
Chloride: 109 mEq/L (ref 96–112)
GFR calc non Af Amer: >60 mL/min (ref >60)
Glucose, Bld: 106 mg/dL — ABNORMAL HIGH (ref 70–99)
Potassium: 3.8 mEq/L (ref 3.5–5.1)
Sodium: 142 mEq/L (ref 135–145)
Total Bilirubin: 0.7 mg/dL (ref 0.3–1.2)

## 2010-06-10 LAB — BASIC METABOLIC PANEL
BUN: 15 mg/dL (ref 6–23)
Creatinine, Ser: 0.82 mg/dL (ref 0.4–1.2)
GFR calc non Af Amer: >60 mL/min (ref >60)
Glucose, Bld: 104 mg/dL — ABNORMAL HIGH (ref 70–99)
Potassium: 3.3 mEq/L — ABNORMAL LOW (ref 3.5–5.1)

## 2010-06-10 LAB — TSH: TSH: 0.892 u[IU]/mL (ref 0.350–4.500)

## 2010-06-10 LAB — TROPONIN I: Troponin I: 0.19 ng/mL — ABNORMAL HIGH (ref 0.00–0.06)

## 2010-06-10 LAB — ANA: Anti Nuclear Antibody(ANA): NEGATIVE

## 2010-06-10 LAB — T4: T4, Total: 8.3 ug/dL (ref 5.0–12.5)

## 2010-06-10 LAB — PROTIME-INR: Prothrombin Time: 14.6 seconds (ref 11.6–15.2)

## 2010-07-13 NOTE — Discharge Summary (Signed)
NAME:  Angela Mcintosh, Angela Mcintosh            ACCOUNT NO.:  [id number]   MEDICAL RECORD NO.:  [id number]          PATIENT TYPE:  INP   LOCATION:  2508                         FACILITY:  [facility]   PHYSICIAN:  Eduardo Osier. Sharyn Lull, M.D. DATE OF BIRTH:  1918-04-01   DATE OF ADMISSION:  05/28/2008  DATE OF DISCHARGE:  05/30/2008                               DISCHARGE SUMMARY   ADMITTING DIAGNOSES:  1. Unstable angina, rule out myocardial infarction.  2. Multivessel coronary artery disease.  3. Hypertension.  4. Hypercholesteremia.  5. Degenerative joint disease.  6. Dementia.   DISCHARGE DIAGNOSES:  1. Status post acute very small non-Q-wave myocardial infarction.  2. Status post percutaneous transluminal coronary angioplasty stenting      to ostial left anterior descending.  3. Multivessel coronary artery disease.  4. Hypertension.  5. Hypercholesteremia.  6. Degenerative joint disease.  7. Dementia.   DISCHARGED HOME MEDICATIONS:  1. Enteric-coated aspirin 81 mg 2 tablets daily.  2. Plavix 75 mg 1 tablet daily with food.  3. Toprol-XL 25 mg 1 tablet daily.  4. Nitro-Dur 0.2 mg per hour apply to chest wall in a.m., off at      night.  5. Crestor 10 mg 1 tablet daily.  6. Pepcid 20 mg 1 tablet twice daily.  7. Nitrostat 0.4 mg sublingual use as directed.  8. Multivitamin 1 tablet daily.  9. Evista as before.   Post PTCA and stent instructions have been given.   FOLLOWUP:  Follow up with me in 1 week.   ACTIVITY:  Increase activity slowly with assistance as tolerated.   No driving.  No lifting.  No pushing for 1 week.   CONDITION AT DISCHARGE:  Stable.   BRIEF HISTORY AND HOSPITAL COURSE:  Ms. Podgorski is an 77 year old  black female with past medical history significant for multivessel  coronary artery disease, status post recent cath, hypertension,  hypercholesteremia, degenerative joint disease, dementia, complains of  retrosternal chest heaviness daily, especially at night and  early  morning requiring sublingual nitro daily at rest.  States, lately her  chest pain frequency and duration has increased associated with feeling  weak, tired and no energy.  The patient had Persantine Myoview in March,  which showed ischemia in the anterior apex, subsequently had left cath  and was noted to have 3-vessel CAD with ostial LAD and mild-to-moderate  distal left main stenosis.  The patient was referred for CABG, but  refused for CABG and opted initially medical management, but due to  recurrent chest pain, rest, anginal pain requiring daily nitro, she  decided to proceed with high-risk limited PCI.  The patient was admitted  to telemetry unit.  The patient ruled in for mild non-Q-wave myocardial  infarction due to elevated troponin-I of 0.10 with normal CPK-MBs.   PAST MEDICAL HISTORY:  As above.   PAST SURGICAL HISTORY:  She had back surgery in the past, had knee  surgery in the past.   MEDICATION AT HOME:  She is on:  1. Enteric-coated aspirin 81 mg p.o. daily.  2. Plavix 75 mg p.o. daily.  3. Toprol-XL 25 mg p.o.  daily.  4. Nitro-Dur 0.2 mg per hour daily.  5. Crestor 10 mg p.o. daily.  6. Nitrostat 0.4 mg sublingual p.r.n.  7. Pepcid 20 mg p.o. b.i.d.  8. Calcium 600 mg daily.  9. Multivitamin 1 tablet daily.  10.Evista daily.   ALLERGIES:  No known drug allergies.   SOCIAL HISTORY:  She is married, has 2 daughters.  No history of  smoking.  Drinks occasionally.  She is a retired Runner, broadcasting/film/video.   FAMILY HISTORY:  Noncontributory.   PHYSICAL EXAMINATION:  GENERAL:  She was alert, awake, and oriented x3  when she was seen in my office.  VITAL SIGNS:  Pulse was 74 regular.  HEENT:  Conjunctivae was pink.  NECK:  Supple.  No JVD.  No bruit.  LUNGS:  Clear to auscultation without rhonchi or rales.  CARDIOVASCULAR:  S1 and S2 was normal.  There was soft systolic murmur.  ABDOMEN:  Soft.  Bowel sounds are present, nontender.  EXTREMITIES:  There is no clubbing,  cyanosis, or edema.   LABORATORIES:  Today; hemoglobin is 12.2, hematocrit 36.1, BUN 9,  creatinine 0.78, and CPK-MB postprocedure is 78 and MB was 1.4.  Troponin I was 0.19, repeat was 0.10 and 0.07.  Glucose was 96.  Platelet count is 227,000.  Her EKG showed normal sinus rhythm with LVH  with poor R-wave progression.  Repeat EKG showed normal sinus rhythm,  possible left atrial enlargement, LVH, and nonspecific T-wave changes  with normalization of R-wave progression in the anterior leads.   BRIEF HOSPITAL COURSE:  The patient was admitted to telemetry unit.  The  patient was ruled in for very mild non-Q-wave myocardial infarction.  The patient subsequently underwent PTCA and stenting to ostial LAD,  which was the culprit lesion for her very small non-Q-wave myocardial  infarction.  The patient tolerated procedure well.  There were no  complications.  The patient did not have any episodes of chest pain  during the hospital stay.  Her groin is stable with no evidence of  hematoma or bruit.  Phase I cardiac rehab as been called.  The patient  will be ambulated in the hallway.  If she tolerates ambulation well, she  will be discharged home and will be followed up in my office in 1 week.  The patient at this point has her activities limited, will be treated  medically at this point and if she continues to have recurrent chest  pain, we will discuss with the family and the patient regarding further  intervention to left circ and RCA.      Eduardo Osier. Sharyn Lull, M.D.  Electronically Signed     MNH/MEDQ  D:  05/30/2008  T:  05/30/2008  Job:  [id number]

## 2010-07-13 NOTE — Op Note (Signed)
NAME:  Angela Mcintosh, CASA            ACCOUNT NO.:  [id number]   MEDICAL RECORD NO.:  [id number]          PATIENT TYPE:  OIB   LOCATION:  3705                         FACILITY:  [facility]   PHYSICIAN:  Mohan N. Sharyn Lull, M.D. DATE OF BIRTH:  03-Feb-1919   DATE OF PROCEDURE:  DATE OF DISCHARGE:                               OPERATIVE REPORT   PROCEDURE:  Left cardiac catheterization with selective left and right  coronary angiography, left ventriculography via right groin using  Judkins technique.   INDICATIONS FOR PROCEDURE:  Ms. Mask is an 77 year old black female  with past medical history significant for hypercholesteremia;  degenerative joint disease; complaints of retrosternal chest heaviness,  localized, grade 6/10, off and on without associated nausea, vomiting,  or diaphoresis.  Denies shortness of breath, denies palpitation,  lightheadedness or syncope.  Denies PND, orthopnea, or leg swelling.  The patient underwent Persantine Myoview on May 07, 2008, which showed  moderate-sized stress-induced ischemia in the anterior apex with normal  EF.  The patient was referred for left cath, possible PCI.   PAST MEDICAL HISTORY:  As above.   PAST SURGICAL HISTORY:  She had back surgery in the past and had left  total knee replacement in the past.   ALLERGY:  No known drug allergies.   MEDICATION:  At home,  1. She is on enteric-coated aspirin 81 mg p.o. daily.  2. WelChol 625 mg p.o. daily.  3. Celebrex 200 mg p.o. daily.  4. Calcium 600 mg p.o. daily.  5. One-a-day vitamin daily.  6. Feosol 1 tablet daily.  7. Evista 60 mg every month.  8. Tylenol p.r.n.   SOCIAL HISTORY:  She is married, has 2 daughters.  No history of  smoking.  Drinks occasionally socially.  She is a retired Runner, broadcasting/film/video.   FAMILY HISTORY:  Noncontributory.   PHYSICAL EXAMINATION:  GENERAL:  She was alert, awake, and oriented x3  in no acute distress.  VITAL SIGNS:  Blood pressure was 130/76 and pulse was  74.  HEENT:  Conjunctivae were pink.  NECK:  Supple.  No JVD.  No bruit.  LUNGS:  Clear to auscultation without rhonchi or rales.  CARDIOVASCULAR:  S1 and S2 was normal.  There was soft systolic murmur.  ABDOMEN:  Soft.  Bowel sounds were present and nontender.  EXTREMITIES:  There was no clubbing, cyanosis, or edema.   IMPRESSION:  1. New onset angina, positive Persantine Myoview, rule out coronary      insufficiency.  2. Hypercholesterolemia.  3. Degenerative joint disease.   Discussed with the patient and her husband regarding Persantine Myoview  result and left cath, possible PTCA stenting, its risks and benefits,  i.e., death, MI, stroke, need for emergency CABG, local vascular  complications, risk of restenosis, etc., and consented for PCI.   PROCEDURE:  After obtaining the informed consent, the patient was  brought to the cath lab and was placed on fluoroscopy table.  Right  groin was prepped and draped in the usual fashion.  Xylocaine  2% was  used for local anesthesia in the right groin.  With the help of thin-  wall needle, 6-French arterial sheath was placed.  The sheath was  aspirated and flushed.  Next, 6-French left Judkins catheter was  advanced over the wire under fluoroscopic guidance up to the ascending  aorta.  Wire was pulled out.  The catheter was aspirated and connected  to the manifold.  Catheter was further advanced and engaged into left  coronary ostium.  Multiple views of the left system were taken.  Next,  catheter was disengaged and was pulled out over the wire and was  replaced with  6-French right Judkins catheter which was advanced over  the wire under fluoroscopic guidance up to the ascending aorta.  Wire  was pulled out.  The catheter was aspirated and connected to the  manifold.  Catheter was further advanced and engaged into right coronary  ostium.  Multiple views of the right system were taken.  Next, the  catheter was disengaged and was pulled  out over the wire and was  replaced with 6-French  pigtail catheter which was advanced over the  wire under fluoroscopic guidance up to the ascending aorta.  Catheter  was further advanced across the aortic valve into the LV.  LV pressures  were recorded.  Next, LV graft was done in 30-degree RAO position.  Post-  angiographic pressures were recorded from LV and then pull-back  pressures were recorded from the aorta.  There was no significant  gradient across the aortic valve.  Next, the pigtail catheter was pulled  out over the wire.  Sheaths were aspirated and flushed.   FINDINGS:  LV showed good LV systolic function, EF of 60-65%.  There was  2+ MR.  Left main has 20-30% osteal and distal stenosis.  LAD has 85-90%  osteal stenosis close to left main, although left main bifurcation could  not be visualized clearly due to a small septal perforator obstructing  the views despite taking multiple views in LAO, cranial view.  LAD  osteal lesion appears to come at obtuse angle and it is right at the  osteum of the left main.  LAD also has 20-30% multiple sequential  proximal and mid stenosis and 60-70% distal bifurcation stenosis with  diagonal 2.  Diagonal 1 is very, very small.  Diagonal 2 has 50-60%  osteal stenosis.  Left circumflex has 70-75% proximal stenosis.  OM-1  and OM-2 were very, very small, OM-3 was moderate size which has 20-25%  proximal stenosis.  RCA has 65-70% proximal stenosis.  PDA and PLV  branches were patent.  The patient tolerated the procedure well.  There  were no complications.  Plan is to recommend CABG and discussed with her  husband and call CVT as consult for possible CABG.      Eduardo Osier. Sharyn Lull, M.D.  Electronically Signed     M[facility]/MEDQ  D:  05/15/2008  T:  05/16/2008  Job:  [id number]

## 2010-07-13 NOTE — Op Note (Signed)
NAME:  Angela Mcintosh, BAINES            ACCOUNT NO.:  [id number]   MEDICAL RECORD NO.:  [id number]          PATIENT TYPE:  INP   LOCATION:                               FACILITY:  [facility]   PHYSICIAN:  Mohan N. Sharyn Lull, M.D. DATE OF BIRTH:  04-Dec-1918   DATE OF PROCEDURE:  05/29/2008  DATE OF DISCHARGE:                               OPERATIVE REPORT   PROCEDURES:  1. Successful percutaneous transluminal coronary angioplasty to ostial      and proximal left anterior descending using 2.5 x 8 mm long Voyager      balloon for predilatation.  2. Successful deployment of 2.75 x 23 mm long thin wall Cypher drug-      eluting stent in proximal and ostial left anterior descending.  3. Successful post dilatation of this stent using 3.0 x 12 mm long [place]      Voyager balloon.   INDICATION FOR THE PROCEDURE:  Ms. Angela Mcintosh is an 77 year old black  female with past medical history significant for multivessel coronary  artery disease, hypertension, hypercholesteremia, degenerative joint  disease, mild dementia, complains of retrosternal chest heaviness daily,  especially at night and early in the morning, requiring sublingual nitro  daily.  States lately, her chest pain frequency and duration has  increased associated with feeling weak, tired, and no energy.  The  patient had Persantine Myoview in March, which showed ischemia in the  anterior apex and subsequently had left cath and was noted to have 3-  vessel CAD with ostial LAD and mild to moderate distal left main  stenosis.  The patient was referred for CABG, but the patient refused  for CABG and initially opted for medical management but as she is having  rest anginal pain requiring daily nitro, came to my office twice and was  admitted to telemetry, yesterday evening with unstable angina.  The  patient ruled in for very small non-Q-wave myocardial infarction with  mildly elevated troponin I.  I discussed with the patient and her  husband at length  regarding various options of treatment, i.e.,  reconsideration for CABG versus medical management versus limited PCI to  ostial LAD, its risks and benefits, i.e., death, MI, stroke, need for  emergency CABG, risk of restenosis, local vascular complications, etc.  and consented for PCI.   PROCEDURE:  After obtaining the informed consent, the patient was  brought to the cath lab and was placed on fluoroscopy table.  Right  groin was prepped and draped in usual fashion.  A 2% Xylocaine was used  for local anesthesia in the right groin.  With the help of thin wall  needle, a XB LAD guiding catheter was advanced over the wire under  fluoroscopic guidance up to the ascending aorta.  Wire was pulled out  the catheter was aspirated and connected to the manifold.  Catheter was  further advanced and engaged into left coronary ostium.  Multiple views  of the left system were taken.   FINDINGS:  Left main had 20-30% ostial and 30-40% distal stenosis.  LAD  has 95-99% ostial and 60-70% proximal stenosis, also had 60-70%  bifurcation stenosis in the midportion, left circumflex has 70-80%  proximal stenosis as before with TIMI grade 3 flow, the lesion is  smooth.   INTERVENTIONAL PROCEDURE:  Successful percutaneous transluminal coronary  angioplasty to ostial and proximal left anterior descending was done  using 2.5 x 8 mm long Voyager balloon for predilatation and then 2.75 x  23 mm long Cypher drug-eluting stent was deployed at 15 atmospheric  pressure.  Stent was postdilated using 3.0 x  12 mm long [place] Voyager balloon going up to 18 atmospheric pressure.  Lesion was dilated for embolization.  The patient received weight-based  Angiomax and 300 mg of Plavix during the procedure.  The patient  tolerated the procedure well.  There were no complications.  The patient  was transferred to recovery room in stable condition.      Eduardo Osier. Sharyn Lull, M.D.  Electronically Signed     MNH/MEDQ  D:   05/29/2008  T:  05/29/2008  Job:  [id number]   cc:   Cath Lab  Osvaldo Shipper. Spruill, M.D.

## 2010-07-13 NOTE — Consult Note (Signed)
NAME:  Angela Mcintosh, Angela Mcintosh            ACCOUNT NO.:  [id number]   MEDICAL RECORD NO.:  [id number]          PATIENT TYPE:  OIB   LOCATION:  3705                         FACILITY:  [facility]   PHYSICIAN:  Salvatore Decent. Cornelius Moras, M.D. DATE OF BIRTH:  1919/01/25   DATE OF CONSULTATION:  05/15/2008  DATE OF DISCHARGE:                                 CONSULTATION   REQUESTING PHYSICIAN:  Mohan N. Sharyn Lull, MD   PRIMARY CARE PHYSICIAN:  Osvaldo Shipper. Spruill, MD   REASON FOR CONSULTATION:  Severe 3-vessel coronary artery disease.   HISTORY OF PRESENT ILLNESS:  Angela Mcintosh is an 77 year old female from  [place] with no previous history of coronary artery disease and risk  factors notable only for history of hypercholesterolemia.  The patient's  past medical history is otherwise notable for the presence of  degenerative joint disease and chronic back pain as well as progressive  pain and weakness in both lower extremities suspicious for claudication.  The patient states that she has been slowing down physically for the  last couple of years.  However, she still lives at home with her husband  and still drives an automobile.  Her ability to get around has been  limited due to exertional pain and weakness in both lower extremities.  She now walks with a cane.  She states that within the last few weeks  she developed new-onset substernal chest pain described as a heaviness  radiating across her chest.  This pain seems to come on in the morning  and frequently wakes her up from her sleep in the morning.  She states  that the pain usually or gradually subside after she gets up and sits  up, and typically it does not bother her any more during the rest of the  day.  The pain is not associated with any shortness of breath.  The  patient denies any shortness of breath either with activity or at rest.  She has not had any tachy palpitations, dizzy spells, nor syncopal  episodes.  She denies PND, orthopnea, or  lower extremity edema.  She was  evaluated by Dr. Sharyn Lull and subsequently admitted for cardiac  catheterization today.  Cardiac catheterization reveals severe 3-vessel  coronary artery disease with normal left ventricular function.  Mrs.  Mcintosh has been referred for possible surgical revascularization.   REVIEW OF SYSTEMS:  GENERAL:  The patient reports progressive  generalized weakness over the last several years.  She has also lost at  least 10-20 pounds in weight over the last few years.  She reports poor  appetite.  CARDIAC:  Notable for recent onset of symptoms of angina as  described.  The patient denies any prolonged episodes of chest pain  lasting more than 30-45 minutes.  RESPIRATORY:  Notable for the absence  of significant shortness of breath either with activity or at rest.  The  patient denies productive cough, hemoptysis, or wheezing.  GASTROINTESTINAL:  Negative.  The patient does admit to having poor  appetite.  She has no difficulty swallowing.  Bowel function is regular.  She denies hematochezia, hematemesis, or melena.  MUSCULOSKELETAL:  Notable for mild chronic pain in her lower back.  The patient describes  chronic aching pain involving both lower extremities including the  thighs and calf muscles that is typically brought on with walking and  relieved by rest.  She also had some aching pain in her legs at night as  well as occasional cramping pain in her calf muscles.  NEUROLOGIC:  Negative.  The patient denies transient monocular blindness.  The  patient denies transient numbness or weakness involving either upper or  lower extremity.  She admits to very mild occasional short-term memory  lapses, and her husband states that she has some dementia.  Her memory  and cognition seem essentially normal throughout our consultation.  GENITOURINARY:  Negative.  INFECTIOUS:  Negative.  HEENT:  Negative.   PAST MEDICAL HISTORY:  1. Hyperlipidemia.  2. Degenerative  joint disease.  3. Chronic bilateral lower extremity pain with ambulation.   PAST SURGICAL HISTORY:  1. Low back surgery x3.  2. Left total knee replacement.   FAMILY HISTORY:  Noncontributory.   SOCIAL HISTORY:  The patient is married and lives with her husband here  in [place].  She has 2 grown daughters.  She is a nonsmoker.  She  denies significant alcohol consumption.  She is a retired Runner, broadcasting/film/video.   MEDICATIONS PRIOR TO ADMISSION:  1. Celebrex 200 mg daily.  2. Aspirin 81 mg daily.  3. WelChol 625 mg daily.  4. Calcium supplement daily.  5. Multivitamin.  6. Iron.  7. Evista.  8. Tylenol as needed.   DRUG ALLERGIES:  None known.   PHYSICAL EXAMINATION:  GENERAL:  The patient is a somewhat frail, but  otherwise, well-appearing African American female who appears in stated  age, in no acute distress.  HEENT:  Grossly unrevealing.  NECK:  Supple.  There are no carotid bruits.  There is no jugular venous  distention.  There is no palpable lymphadenopathy.  CHEST:  Auscultation  of the chest demonstrates clear breath sounds bilaterally.  No wheezes  or rhonchi noted.  Breath sounds are symmetrical.  CARDIOVASCULAR:  Notable for regular rate and rhythm.  No murmurs, rubs,  or gallops are appreciated.  ABDOMEN:  Soft, nondistended, and nontender.  There are no palpable  masses.  Bowel sounds are present.  EXTREMITIES:  Warm and adequately perfused.  There is mild bilateral  lower extremity edema.  Distal pulses are not palpable in either lower  leg at the ankle.  There are changes of mild chronic venous  insufficiency.  SKIN:  Frail, but there are no open skin lesions or ulcerations.  RECTAL AND GU:  Both deferred.  NEUROLOGIC:  Grossly nonfocal and symmetrical throughout.   DIAGNOSTIC TESTS:  Cardiac catheterization performed by Dr. Sharyn Lull  earlier today is reviewed.  This demonstrates 90% proximal stenosis of  the left anterior descending coronary artery with 70%  stenosis in the  midportion of left anterior descending coronary artery around takeoff of  the diagonal branch.  There is 70-80% proximal stenosis of the left  circumflex coronary artery which gives rise to a large circumflex  marginal branch.  There is long 80% proximal stenosis of the right  coronary artery and 60-70% proximal stenosis of the posterior descending  coronary artery and right dominant coronary circulation.  Left  ventricular function appears normal.  There is mild mitral  regurgitation, but this may be catheter induced.   IMPRESSION:  Severe 3-vessel coronary artery disease with recent onset  symptoms of chest  pain occurring at rest consistent with angina  pectoris.  Based upon coronary anatomy, I agree that the best long-term  treatment for Ms. Nevares's coronary artery disease would involve  surgical revascularization.  However, the risks of surgery will be  increased primarily due to the patient's advanced age and gradually  declining physical activity.  Alternative treatment strategies might  include somewhat risky percutaneous coronary intervention versus long-  term medical therapy.   PLAN:  I have discussed options at length with Ms. Lapenna and her  husband this afternoon.  Mrs. Hanigan has quite adamantly opposed to  any consideration as far as having open heart surgery.  She seems to  have a clear understanding of the implications of her decision.  She  would like to discuss other treatment alternatives.  She understands  that if she declines to proceed with surgery at this juncture, she may  not be a candidate for surgery in the future should her condition  decline further or should she suffer an acute myocardial infarction with  or without previous attempted percutaneous coronary intervention.  She  would not be considered a candidate for emergency salvage bypass surgery  in the setting of failed angioplasty.  All their questions have been   addressed.      Salvatore Decent. Cornelius Moras, M.D.  Electronically Signed     CHO/MEDQ  D:  05/15/2008  T:  05/16/2008  Job:  [id number]   cc:   Eduardo Osier. Sharyn Lull, M.D.  Osvaldo Shipper. Spruill, M.D.

## 2010-07-13 NOTE — Discharge Summary (Signed)
NAME:  Angela Mcintosh, Angela Mcintosh            ACCOUNT NO.:  [id number]   MEDICAL RECORD NO.:  [id number]          PATIENT TYPE:  INP   LOCATION:  3705                         FACILITY:  [facility]   PHYSICIAN:  Eduardo Osier. Sharyn Lull, M.D. DATE OF BIRTH:  [birth year]   DATE OF ADMISSION:  05/15/2008  DATE OF DISCHARGE:  05/16/2008                               DISCHARGE SUMMARY   ADMISSION DIAGNOSES:  1. New-onset angina, positive Persantine Myoview.  2. hypercholesteremia.  3. Degenerative joint disease.   DISCHARGE DIAGNOSES:  1. Stable angina, status post left catheterization.  2. Three-vessel coronary artery disease.  3. Hypertension.  4. Hypercholesteremia.  5. Degenerative joint disease.  6. Mild dementia.   DISCHARGE HOME MEDICATIONS:  1. Enteric-coated aspirin 81 mg 1 tablet daily.  2. Plavix 75 mg 1 tablet daily with food.  3. Toprol-XL 25 mg half tablet daily.  4. Nitro-Dur 0.2 mg per hour apply to chest wall in a.m., off at      night.  5. Crestor 10 mg 1 tablet daily.  6. Nitrostat 0.4 mg sublingual use as directed.  7. Pepcid 20 mg 1 tablet twice daily.  8. Calcium 600 mg 1 tablet daily.  9. Multivitamin 1 tablet daily.  10.Evista as before.   Postcardiac cath instructions have been given.   DIET:  Low salt, low cholesterol.   The patient has been advised to avoid any lifting, pushing or pulling or  driving for 1 week, walk with assistance.  Follow up with me in 1 week.  Follow with Dr. Shana Chute as scheduled.   CONDITION AT DISCHARGE:  Stable.   BRIEF HISTORY AND HOSPITAL COURSE:  Ms.  Mcintosh is 77 year old black  female with past medical history significant for hypercholesteremia,  degenerative joint disease, complains of retrosternal chest heaviness  localized, graded 6/10, off and on without associated nausea, vomiting,  diaphoresis.  Denies shortness of breath and palpitation,  lightheadedness, or syncope.  Denies PND, orthopnea, or leg swelling.  The patient underwent  Persantine Myoview on May 07, 2008, which showed  moderate-sized stress-induced ischemia in the anterior apex with normal  EF.  The patient was referred for left cath.   PAST MEDICAL HISTORY:  As above.   PAST SURGICAL HISTORY:  She had back surgery in the past and had left  knee surgery in the past.   ALLERGIES:  No known drug allergies.   MEDICATION AT HOME:  She is on Celebrex, aspirin, WelChol, calcium,  vitamin, Feosol, Evista, and Tylenol p.r.n.   SOCIAL HISTORY:  She was married and has 2 daughters.  No history of  smoking.  Drinks occasionally socially.  She is retired Runner, broadcasting/film/video.   Family history is noncontributory.   PHYSICAL EXAMINATION:  GENERAL:  She was alert, awake, and oriented x3  in no acute distress.  VITAL SIGNS:  Blood pressure was 130/76, pulse was 74 and regular.  Conjunctivae were pink.  NECK:  Supple.  No JVD.  No bruit.  LUNGS:  Clear to auscultation without rhonchi or rales.  CARDIOVASCULAR:  S1, S2 was normal.  There was soft systolic murmur.  ABDOMEN:  Soft.  Bowel sounds were present and nontender.  EXTREMITIES:  There was no clubbing, cyanosis, or edema.   BRIEF HOSPITAL COURSE:  The patient was admitted and underwent left  cardiac cath with selective left and right coronary angiography as per  procedure report.  The patient tolerated the procedure well.  There were  no complications.  The patient was noted to have three-vessel disease  and it was felt that she will benefit from CABG as she has good distal  targets.  CVTS consultation was obtained with Dr. Cornelius Moras.  The patient and  her husband had long discussion with Dr. Cornelius Moras that the patient refused  for open-heart surgery, discussed at length again today with her  daughter and her husband regarding various options of treatment, i.e. re-  concentration for surgery versus medical treatment versus high-risk PCI.  The patient opted for medical management only at this point.  If she  continues to have  recurrent chest pain, she may consider percutaneous  intervention.  Discussed at length with the patient and family and  agrees with the above plan.  The patient will be discharged home on  above medications and will be followed up in my office in 1 week and  with Dr. Shana Chute as scheduled.  The patient has been advised that if she  gets any heaviness, pressure, tightness, should call 911 immediately and  come to the hospital.      Eduardo Osier. Sharyn Lull, M.D.  Electronically Signed     MNH/MEDQ  D:  05/16/2008  T:  05/17/2008  Job:  [id number]   cc:   Osvaldo Shipper. Spruill, M.D.  Salvatore Decent. Cornelius Moras, M.D.

## 2010-08-24 ENCOUNTER — Emergency Department (HOSPITAL_COMMUNITY)
Admission: EM | Admit: 2010-08-24 | Discharge: 2010-08-24 | Disposition: A | Discharge status: Home / Self Care | Payer: Medicare Other | Attending: Emergency Medicine | Admitting: Emergency Medicine

## 2010-08-24 ENCOUNTER — Emergency Department (HOSPITAL_COMMUNITY): Payer: Medicare Other

## 2010-08-24 DIAGNOSIS — R51 Headache: Secondary | ICD-10-CM | POA: Insufficient documentation

## 2010-08-24 DIAGNOSIS — I2510 Atherosclerotic heart disease of native coronary artery without angina pectoris: Secondary | ICD-10-CM | POA: Insufficient documentation

## 2010-08-24 DIAGNOSIS — E7800 Pure hypercholesterolemia, unspecified: Secondary | ICD-10-CM | POA: Insufficient documentation

## 2010-08-24 DIAGNOSIS — Y9229 Other specified public building as the place of occurrence of the external cause: Secondary | ICD-10-CM | POA: Insufficient documentation

## 2010-08-24 DIAGNOSIS — R220 Localized swelling, mass and lump, head: Secondary | ICD-10-CM | POA: Insufficient documentation

## 2010-08-24 DIAGNOSIS — R42 Dizziness and giddiness: Secondary | ICD-10-CM | POA: Insufficient documentation

## 2010-08-24 DIAGNOSIS — R221 Localized swelling, mass and lump, neck: Secondary | ICD-10-CM | POA: Insufficient documentation

## 2010-08-24 DIAGNOSIS — W010XXA Fall on same level from slipping, tripping and stumbling without subsequent striking against object, initial encounter: Secondary | ICD-10-CM | POA: Insufficient documentation

## 2010-08-24 DIAGNOSIS — S1093XA Contusion of unspecified part of neck, initial encounter: Secondary | ICD-10-CM | POA: Insufficient documentation

## 2010-08-24 DIAGNOSIS — R609 Edema, unspecified: Secondary | ICD-10-CM | POA: Insufficient documentation

## 2010-08-24 DIAGNOSIS — S0003XA Contusion of scalp, initial encounter: Secondary | ICD-10-CM | POA: Insufficient documentation

## 2010-08-24 DIAGNOSIS — R55 Syncope and collapse: Secondary | ICD-10-CM | POA: Insufficient documentation

## 2010-08-24 LAB — BASIC METABOLIC PANEL
Chloride: 105 mEq/L (ref 96–112)
Creatinine, Ser: 0.88 mg/dL (ref 0.50–1.10)
GFR calc Af Amer: >60 mL/min (ref >60)
Potassium: 3.9 mEq/L (ref 3.5–5.1)

## 2010-08-24 LAB — DIFFERENTIAL
Basophils Absolute: 0 10*3/uL (ref 0.0–0.1)
Basophils Relative: 0 % (ref 0–1)
Eosinophils Absolute: 0.1 10*3/uL (ref 0.0–0.7)
Eosinophils Relative: 2 % (ref 0–5)

## 2010-08-24 LAB — CBC
MCV: 93.2 fL (ref 78.0–100.0)
Platelets: 240 10*3/uL (ref 150–400)
RDW: 14.4 % (ref 11.5–15.5)
WBC: 5 10*3/uL (ref 4.0–10.5)

## 2010-08-24 LAB — TROPONIN I: Troponin I: <0.3 ng/mL (ref <0.30)

## 2010-10-29 IMAGING — CR DG CHEST 2V
2 series · 2 of 2 positions shown · non-contrast
Comparison: Pain chest x-ray of 08/23/2006

CLINICAL DATA: Fatigue, chest pain

CHEST - 2 VIEW

[view not recorded (1 of 2)]
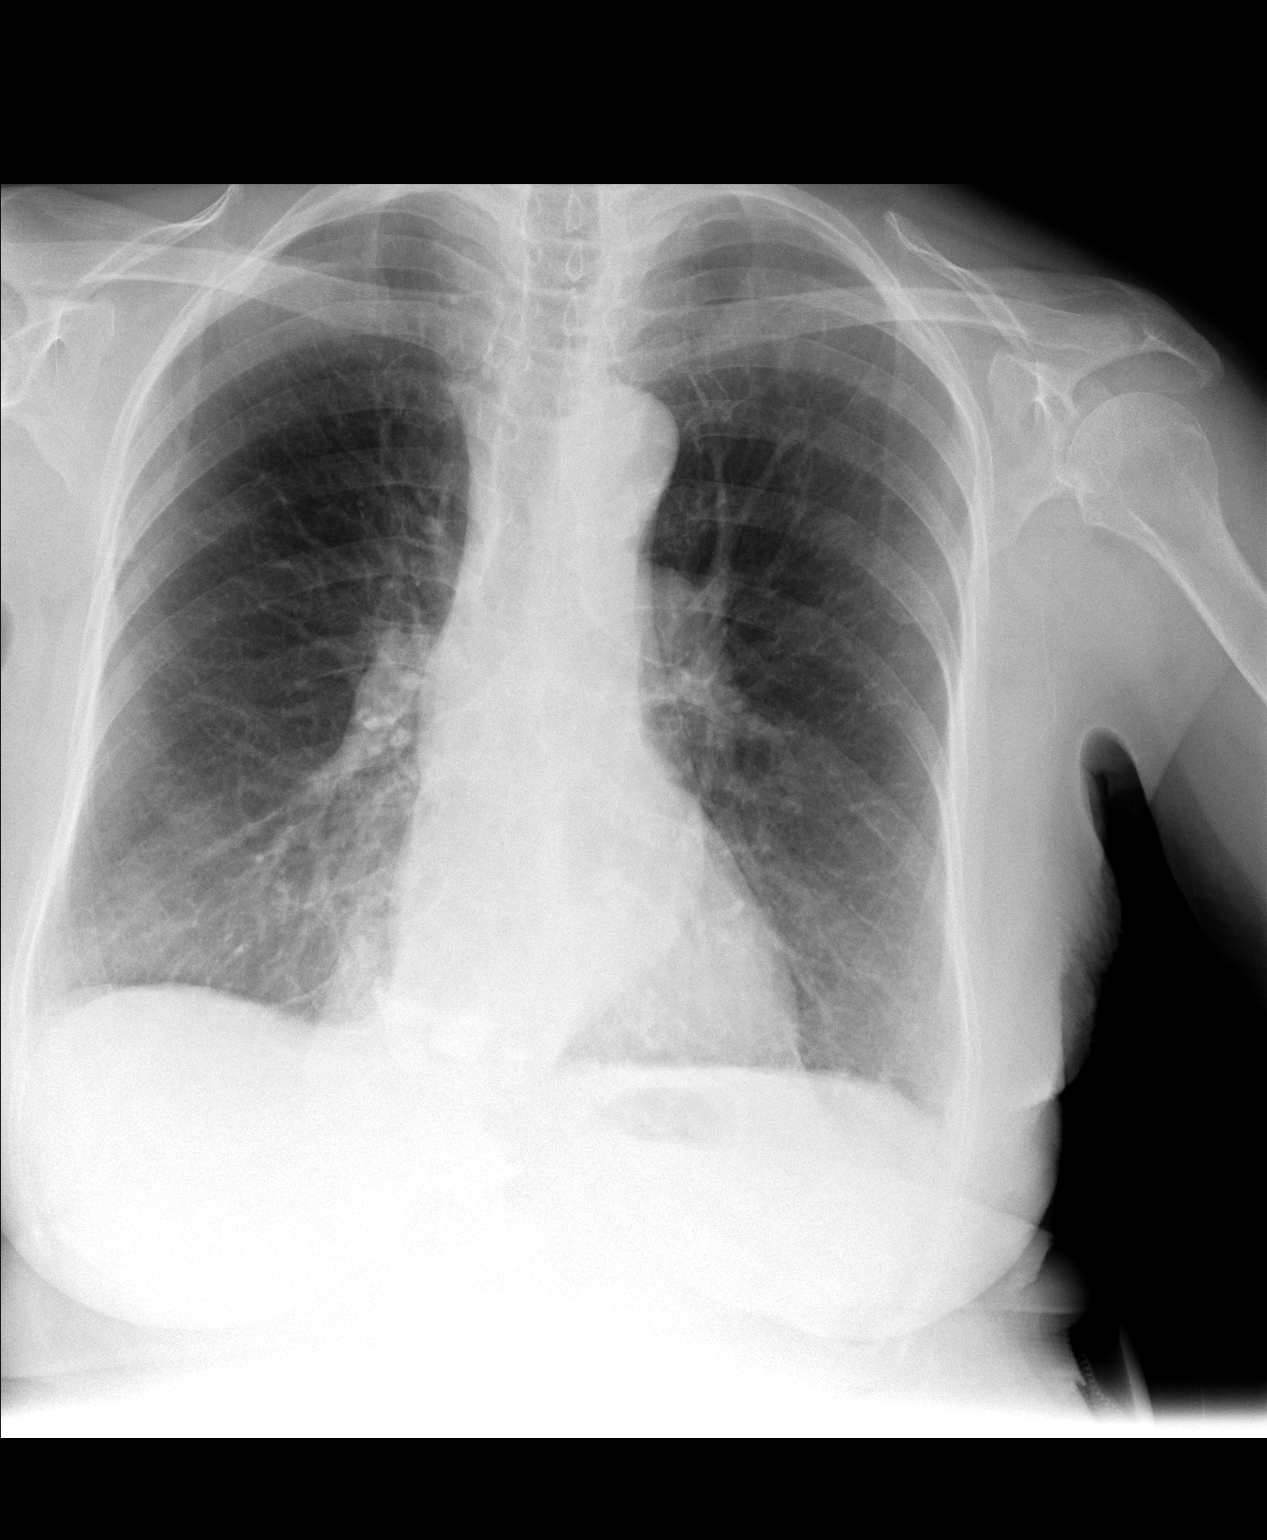

[view not recorded (2 of 2)]
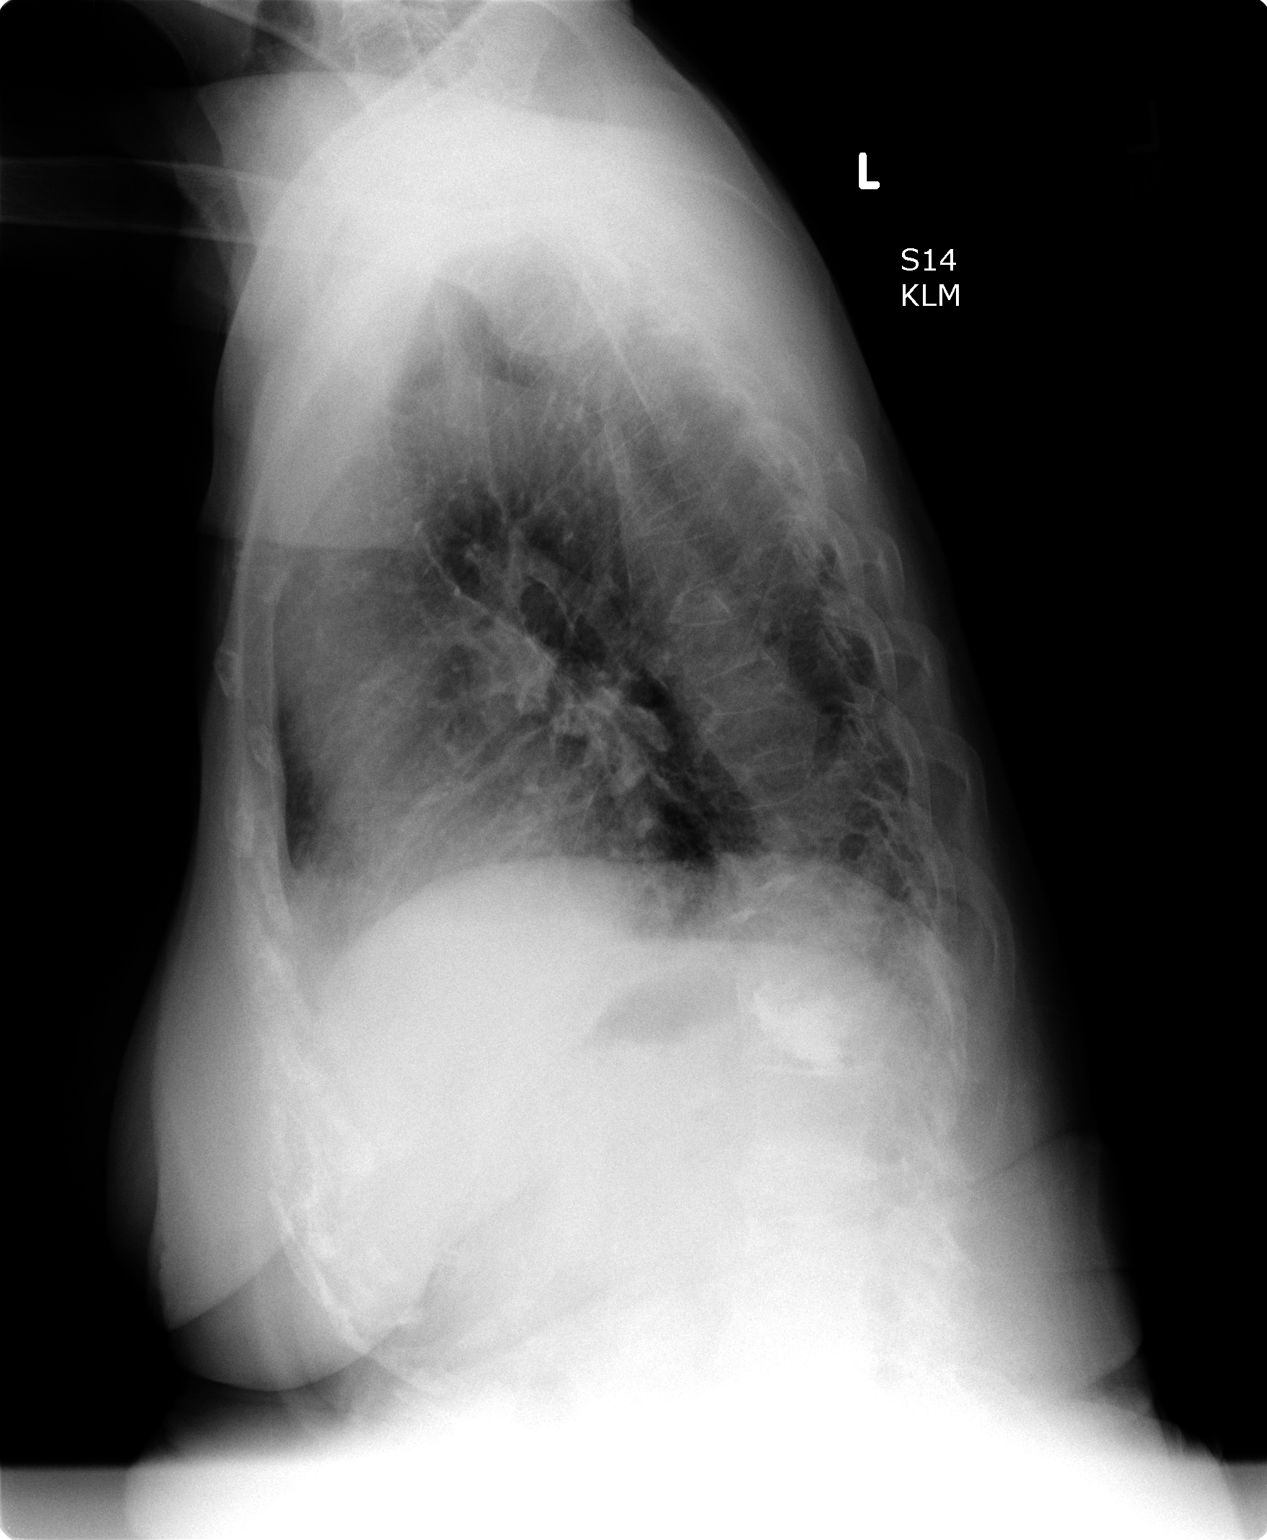

[2 of 2 positions shown; findings below may reference images not displayed]

FINDINGS: No active infiltrate or effusion is seen.  The heart is
within normal limits in size.  The bones are osteopenic and a lower
thoracic vertebroplasty is noted.
IMPRESSION: No active lung disease.  Osteopenia.

## 2010-12-15 LAB — DIFFERENTIAL
Basophils Relative: 0
Eosinophils Absolute: 0
Eosinophils Relative: 0
Monocytes Absolute: 0.6
Monocytes Relative: 6
Neutrophils Relative %: 91 — ABNORMAL HIGH

## 2010-12-15 LAB — URINALYSIS, ROUTINE W REFLEX MICROSCOPIC
Bilirubin Urine: NEGATIVE
Nitrite: POSITIVE — AB
Specific Gravity, Urine: 1.016
Urobilinogen, UA: 0.2

## 2010-12-15 LAB — CBC
HCT: 37.2
Hemoglobin: 12.4
MCHC: 33.2
MCV: 93.4
RBC: 3.98

## 2010-12-15 LAB — URINE MICROSCOPIC-ADD ON

## 2011-03-05 ENCOUNTER — Other Ambulatory Visit: Payer: Self-pay | Admitting: Cardiology

## 2011-03-18 ENCOUNTER — Other Ambulatory Visit: Payer: Self-pay | Admitting: Cardiology

## 2011-04-29 ENCOUNTER — Other Ambulatory Visit: Payer: Self-pay | Admitting: Cardiology

## 2011-05-05 ENCOUNTER — Other Ambulatory Visit: Payer: Self-pay

## 2011-05-05 ENCOUNTER — Emergency Department (HOSPITAL_COMMUNITY): Payer: Medicare Other

## 2011-05-05 ENCOUNTER — Encounter (HOSPITAL_COMMUNITY): Payer: Self-pay

## 2011-05-05 ENCOUNTER — Observation Stay (HOSPITAL_COMMUNITY)
Admission: EM | Admit: 2011-05-05 | Discharge: 2011-05-05 | Disposition: A | Discharge status: Home Health | Payer: Medicare Other | Attending: Emergency Medicine | Admitting: Emergency Medicine

## 2011-05-05 DIAGNOSIS — E7800 Pure hypercholesterolemia, unspecified: Secondary | ICD-10-CM | POA: Insufficient documentation

## 2011-05-05 DIAGNOSIS — R079 Chest pain, unspecified: Principal | ICD-10-CM | POA: Insufficient documentation

## 2011-05-05 DIAGNOSIS — N390 Urinary tract infection, site not specified: Secondary | ICD-10-CM

## 2011-05-05 DIAGNOSIS — Z7902 Long term (current) use of antithrombotics/antiplatelets: Secondary | ICD-10-CM | POA: Insufficient documentation

## 2011-05-05 DIAGNOSIS — Z79899 Other long term (current) drug therapy: Secondary | ICD-10-CM | POA: Insufficient documentation

## 2011-05-05 HISTORY — DX: Atherosclerotic heart disease of native coronary artery without angina pectoris: I25.10

## 2011-05-05 HISTORY — DX: Pure hypercholesterolemia, unspecified: E78.00

## 2011-05-05 LAB — DIFFERENTIAL
Basophils Absolute: 0 10*3/uL (ref 0.0–0.1)
Eosinophils Relative: 4 % (ref 0–5)
Lymphocytes Relative: 21 % (ref 12–46)
Neutro Abs: 3.6 10*3/uL (ref 1.7–7.7)
Neutrophils Relative %: 67 % (ref 43–77)

## 2011-05-05 LAB — URINE MICROSCOPIC-ADD ON

## 2011-05-05 LAB — URINALYSIS, ROUTINE W REFLEX MICROSCOPIC
Glucose, UA: NEGATIVE mg/dL
Protein, ur: NEGATIVE mg/dL
Specific Gravity, Urine: 1.02 (ref 1.005–1.030)

## 2011-05-05 LAB — COMPREHENSIVE METABOLIC PANEL
ALT: 11 U/L (ref 0–35)
AST: 23 U/L (ref 0–37)
CO2: 27 mEq/L (ref 19–32)
Calcium: 8.3 mg/dL — ABNORMAL LOW (ref 8.4–10.5)
GFR calc non Af Amer: 73 mL/min — ABNORMAL LOW (ref >90)
Sodium: 140 mEq/L (ref 135–145)

## 2011-05-05 LAB — CBC
MCV: 93.8 fL (ref 78.0–100.0)
Platelets: 227 10*3/uL (ref 150–400)
RDW: 14 % (ref 11.5–15.5)
WBC: 5.4 10*3/uL (ref 4.0–10.5)

## 2011-05-05 LAB — TROPONIN I: Troponin I: <0.3 ng/mL (ref <0.30)

## 2011-05-05 NOTE — ED Notes (Signed)
Reports onset left side CP, non radiating when sitting down for breakfast. Pain to underneath left breast & described as sharp in nature. No n/v, diaphoresis, +SOB. States pain worsened with deep breaths & mvmt. Denied cold, cough, fever. Daughter gave 2 baby ASA & NTG SL x1. States episode CP gone after 15 min. Denies any CP presently

## 2011-05-05 NOTE — Progress Notes (Signed)
Observation review is complete. 

## 2011-05-05 NOTE — ED Provider Notes (Signed)
History     CSN: [id number]  Arrival date & time 05/05/11  7829   First MD Initiated Contact with Patient 05/05/11 [phone]      Chief Complaint  Patient presents with  . Chest Pain    HPI This elderly female with dementia, episodic chest pain presents following episode of chest pain.  History of present illness per the patient's daughter presents with her.  The patient had a 15 minute episode of chest pain that began subacutely about an hour prior to arrival.  The pain was substernal, sharp, nonradiating, nonexertional, nonpleuritic, mildly positional.  The pain was improved with nitroglycerin tablets and aspirin.  The patient denies any dyspnea or currently.  Per the patient's daughter the patient has been in her usual state of health. Past Medical History  Diagnosis Date  . Diagnosis unknown     History reviewed. No pertinent past surgical history.  No family history on file.  History  Substance Use Topics  . Smoking status: Not on file  . Smokeless tobacco: Not on file  . Alcohol Use:     OB History    Grav Para Term Preterm Abortions TAB SAB Ect Mult Living                  Review of Systems  Unable to perform ROS: Dementia    Allergies  Review of patient's allergies indicates no known allergies.  Home Medications   Current Outpatient Rx  Name Route Sig Dispense Refill  . CLOPIDOGREL BISULFATE 75 MG PO TABS Oral Take 75 mg by mouth daily.    . DONEPEZIL HCL 5 MG PO TABS Oral Take 5 mg by mouth daily.    Marland Kitchen FAMOTIDINE 20 MG PO TABS Oral Take 20 mg by mouth 2 (two) times daily.    Marland Kitchen RALOXIFENE HCL 60 MG PO TABS Oral Take 60 mg by mouth daily.    Marland Kitchen ROSUVASTATIN CALCIUM 10 MG PO TABS Oral Take 10 mg by mouth daily.      BP 121/77  Pulse 81  Temp(Src) 98.5 F (36.9 C) (Oral)  Resp 18  Ht 4\' 6"  (1.372 m)  Wt 117 lb (53.071 kg)  BMI 28.21 kg/m2  SpO2 97%  Physical Exam  Nursing note and vitals reviewed. Constitutional: She is oriented to person, place, and  time. She appears well-developed and well-nourished. No distress.  HENT:  Head: Normocephalic and atraumatic.  Eyes: Conjunctivae and EOM are normal.  Cardiovascular: Normal rate and regular rhythm.   Murmur heard. Pulmonary/Chest: Effort normal and breath sounds normal. No stridor. No respiratory distress.  Abdominal: She exhibits no distension.  Musculoskeletal: She exhibits no edema.  Neurological: She is alert and oriented to person, place, and time. No cranial nerve deficit.  Skin: Skin is warm and dry.  Psychiatric: She has a normal mood and affect.    ED Course  Procedures (including critical care time)   Labs Reviewed  CBC  DIFFERENTIAL  COMPREHENSIVE METABOLIC PANEL  TROPONIN I  URINALYSIS, ROUTINE W REFLEX MICROSCOPIC   No results found.   No diagnosis found.  CXR reviewed by me  Pulse ox 97% ra- normal  Cardiac monitor: 80 sr - normal   Date: 05/05/2011  Rate: 83  Rhythm: normal sinus rhythm  QRS Axis: left  Intervals: normal  ST/T Wave abnormalities: normal  Conduction Disutrbances:left anterior fascicular block  Narrative Interpretation:   Old EKG Reviewed: unchanged ABNORMAL ECG   MDM  This 78yo F w dementia, already on  Plavix, aspirin presents following an episode of characteristically similar CP to multiple prior events.  On my exam the patient was in no distress, with no complaints.  I spent a considerable time with the patient and her family discussing the options of care.  Given that the patient is oriented Plavix, aspirin, has no ongoing complaints, and unchanged ECG, labs that are largely reassuring, we agreed that the patient would have serial troponins and if she remains asymptomatic with negative results she'll be discharged to follow up with her primary care physician.  The patient was transferred to the clinical decision unit for the remainder of her care.  Gerhard Munch, MD 05/05/11 [id number]

## 2011-05-05 NOTE — ED Notes (Signed)
Pt presents with 2-3 day h/o L sided chest pain that worsened this morning.  Pt was given ASA and another medication unknown to her by daughter.  Pt reports no pain at present.  -nausea or shortness of breath.

## 2011-05-05 NOTE — ED Notes (Signed)
Pt assisted to the restroom tolerated well

## 2011-05-05 NOTE — ED Provider Notes (Signed)
I was available for the completion of this patient's ED CDU evaluation.  Please see the initial ED note for Attending MD evaluation.  Gerhard Munch, MD 05/05/11 1549

## 2011-05-05 NOTE — ED Notes (Signed)
Patient placed on monitor upon return.

## 2011-05-05 NOTE — ED Provider Notes (Signed)
Patient sent to CDU by Dr. Jeraldine Loots for pending serial troponin. Patient is resting comfortably with no complaint. Denies CP. Dr. Jeraldine Loots states that he spoke at length with family about treatment plan and if last troponin is negative and VSS without changes in status while waiting then patient is to follow up with Dr. Sharyn Lull this week.   Angela Mcintosh, [place] 05/05/11 1327

## 2011-07-24 ENCOUNTER — Other Ambulatory Visit: Payer: Self-pay | Admitting: Cardiology

## 2011-08-30 ENCOUNTER — Other Ambulatory Visit: Payer: Self-pay | Admitting: Cardiology

## 2011-09-30 ENCOUNTER — Other Ambulatory Visit: Payer: Self-pay | Admitting: Cardiology

## 2011-11-04 ENCOUNTER — Other Ambulatory Visit: Payer: Self-pay | Admitting: Cardiology

## 2012-01-30 ENCOUNTER — Other Ambulatory Visit: Payer: Self-pay | Admitting: Internal Medicine

## 2012-01-30 DIAGNOSIS — Z780 Asymptomatic menopausal state: Secondary | ICD-10-CM

## 2012-02-08 ENCOUNTER — Other Ambulatory Visit: Payer: Medicare Other

## 2012-02-09 ENCOUNTER — Ambulatory Visit
Admission: RE | Admit: 2012-02-09 | Discharge: 2012-02-09 | Disposition: A | Discharge status: Home / Self Care | Payer: Medicare Other | Source: Ambulatory Visit | Attending: Internal Medicine | Admitting: Internal Medicine

## 2012-02-09 DIAGNOSIS — Z780 Asymptomatic menopausal state: Secondary | ICD-10-CM

## 2012-03-23 ENCOUNTER — Emergency Department (HOSPITAL_COMMUNITY)
Admission: EM | Admit: 2012-03-23 | Discharge: 2012-03-23 | Disposition: A | Discharge status: Home / Self Care | Payer: Medicare Other | Attending: Emergency Medicine | Admitting: Emergency Medicine

## 2012-03-23 ENCOUNTER — Emergency Department (HOSPITAL_COMMUNITY): Payer: Medicare Other

## 2012-03-23 ENCOUNTER — Encounter (HOSPITAL_COMMUNITY): Payer: Self-pay

## 2012-03-23 DIAGNOSIS — R5381 Other malaise: Secondary | ICD-10-CM | POA: Insufficient documentation

## 2012-03-23 DIAGNOSIS — Z9861 Coronary angioplasty status: Secondary | ICD-10-CM | POA: Insufficient documentation

## 2012-03-23 DIAGNOSIS — R209 Unspecified disturbances of skin sensation: Secondary | ICD-10-CM | POA: Insufficient documentation

## 2012-03-23 DIAGNOSIS — Z79899 Other long term (current) drug therapy: Secondary | ICD-10-CM | POA: Insufficient documentation

## 2012-03-23 DIAGNOSIS — R471 Dysarthria and anarthria: Secondary | ICD-10-CM | POA: Insufficient documentation

## 2012-03-23 DIAGNOSIS — I2510 Atherosclerotic heart disease of native coronary artery without angina pectoris: Secondary | ICD-10-CM | POA: Insufficient documentation

## 2012-03-23 DIAGNOSIS — Z7901 Long term (current) use of anticoagulants: Secondary | ICD-10-CM | POA: Insufficient documentation

## 2012-03-23 DIAGNOSIS — E7800 Pure hypercholesterolemia, unspecified: Secondary | ICD-10-CM | POA: Insufficient documentation

## 2012-03-23 DIAGNOSIS — R531 Weakness: Secondary | ICD-10-CM

## 2012-03-23 DIAGNOSIS — F0390 Unspecified dementia without behavioral disturbance: Secondary | ICD-10-CM | POA: Insufficient documentation

## 2012-03-23 LAB — CBC
HCT: 34.6 % — ABNORMAL LOW (ref 36.0–46.0)
Hemoglobin: 10.9 g/dL — ABNORMAL LOW (ref 12.0–15.0)
MCH: 28.5 pg (ref 26.0–34.0)
MCHC: 31.5 g/dL (ref 30.0–36.0)
MCV: 90.6 fL (ref 78.0–100.0)
Platelets: 237 K/uL (ref 150–400)
RBC: 3.82 MIL/uL — ABNORMAL LOW (ref 3.87–5.11)
RDW: 15.1 % (ref 11.5–15.5)
WBC: 6 K/uL (ref 4.0–10.5)

## 2012-03-23 LAB — COMPREHENSIVE METABOLIC PANEL WITH GFR
ALT: 7 U/L (ref 0–35)
AST: 21 U/L (ref 0–37)
Albumin: 2.7 g/dL — ABNORMAL LOW (ref 3.5–5.2)
Alkaline Phosphatase: 53 U/L (ref 39–117)
BUN: 15 mg/dL (ref 6–23)
CO2: 24 meq/L (ref 19–32)
Calcium: 8.7 mg/dL (ref 8.4–10.5)
Chloride: 107 meq/L (ref 96–112)
Creatinine, Ser: 0.73 mg/dL (ref 0.50–1.10)
GFR calc Af Amer: 84 mL/min — ABNORMAL LOW
GFR calc non Af Amer: 72 mL/min — ABNORMAL LOW
Glucose, Bld: 116 mg/dL — ABNORMAL HIGH (ref 70–99)
Potassium: 3.8 meq/L (ref 3.5–5.1)
Sodium: 143 meq/L (ref 135–145)
Total Bilirubin: 0.2 mg/dL — ABNORMAL LOW (ref 0.3–1.2)
Total Protein: 5.8 g/dL — ABNORMAL LOW (ref 6.0–8.3)

## 2012-03-23 LAB — POCT I-STAT, CHEM 8
Glucose, Bld: 111 mg/dL — ABNORMAL HIGH (ref 70–99)
HCT: 34 % — ABNORMAL LOW (ref 36.0–46.0)
Hemoglobin: 11.6 g/dL — ABNORMAL LOW (ref 12.0–15.0)
Potassium: 3.7 mEq/L (ref 3.5–5.1)
TCO2: 26 mmol/L (ref 0–100)

## 2012-03-23 LAB — URINE MICROSCOPIC-ADD ON

## 2012-03-23 LAB — DIFFERENTIAL
Basophils Absolute: 0 K/uL (ref 0.0–0.1)
Basophils Relative: 0 % (ref 0–1)
Eosinophils Absolute: 0.3 K/uL (ref 0.0–0.7)
Eosinophils Relative: 4 % (ref 0–5)
Lymphocytes Relative: 21 % (ref 12–46)
Lymphs Abs: 1.2 K/uL (ref 0.7–4.0)
Monocytes Absolute: 0.4 K/uL (ref 0.1–1.0)
Monocytes Relative: 7 % (ref 3–12)
Neutro Abs: 4.1 K/uL (ref 1.7–7.7)
Neutrophils Relative %: 68 % (ref 43–77)

## 2012-03-23 LAB — URINALYSIS, ROUTINE W REFLEX MICROSCOPIC
Ketones, ur: NEGATIVE mg/dL
Nitrite: POSITIVE — AB
Specific Gravity, Urine: 1.023 (ref 1.005–1.030)
Urobilinogen, UA: 0.2 mg/dL (ref 0.0–1.0)
pH: 5 (ref 5.0–8.0)

## 2012-03-23 LAB — PROTIME-INR: INR: 1.06 (ref 0.00–1.49)

## 2012-03-23 LAB — TROPONIN I: Troponin I: <0.3 ng/mL

## 2012-03-23 LAB — GLUCOSE, CAPILLARY: Glucose-Capillary: 111 mg/dL — ABNORMAL HIGH (ref 70–99)

## 2012-03-23 MED ORDER — CLOPIDOGREL BISULFATE 75 MG PO TABS: 75.0000 mg | ORAL_TABLET | Freq: Every day | ORAL | Status: DC

## 2012-03-23 NOTE — ED Notes (Signed)
Pt dc to home.  Pt taken by w/c to exit by daughter.  Pt states understanding to dc paperwork.

## 2012-03-23 NOTE — ED Notes (Signed)
Patient asked to be placed on bed pan.  Bed pan provided.  Patient unable to void while on bed pan.

## 2012-03-23 NOTE — ED Provider Notes (Signed)
History     CSN: [id number]  Arrival date & time 03/23/12  1636   First MD Initiated Contact with Patient 03/23/12 1646      Chief Complaint  Patient presents with  . Weakness    (Consider location/radiation/quality/duration/timing/severity/associated sxs/prior treatment) HPI The patient presents with weakness.  History of present illness is per the patient's daughter, who has been present with the patient throughout the day today.  The patient has very poor hearing. The patient's daughter states that yesterday the patient was in her usual state of health. Today, upon awakening, the patient had mild dysarthria, and throughout the day she has had episodic spontaneous onset and offset neurologic deficiencies, including weakness on the right, numbness on the right. Throughout the day she has had no pain, no dyspnea, no vomiting, no diarrhea, no fever. The patient's daughter notes that the patient has a history of mimicking medical conditions that she sees on television.  Past Medical History  Diagnosis Date  . Coronary artery disease   . High cholesterol   . Dementia     Past Surgical History  Procedure Date  . Coronary angioplasty with stent placement     No family history on file.  History  Substance Use Topics  . Smoking status: Never Smoker   . Smokeless tobacco: Not on file  . Alcohol Use: No    OB History    Grav Para Term Preterm Abortions TAB SAB Ect Mult Living                  Review of Systems  Constitutional:       Per HPI, otherwise negative  HENT:       Per HPI, otherwise negative  Eyes: Negative.   Respiratory:       Per HPI, otherwise negative  Cardiovascular:       Per HPI, otherwise negative  Gastrointestinal: Negative for vomiting.  Genitourinary: Negative.   Musculoskeletal:       Per HPI, otherwise negative  Skin: Negative.   Neurological: Positive for weakness. Negative for syncope.       Baseline extremity weakness in her LE w no  described persistent changes    Allergies  Review of patient's allergies indicates no known allergies.  Home Medications   Current Outpatient Rx  Name  Route  Sig  Dispense  Refill  . CALCIUM-VITAMIN D 600-200 MG-UNIT PO CAPS   Oral   Take by mouth.         . [facility] COLD PLUS PO   Oral   Take 2 capsules by mouth once as needed. For sneezing episode         . VITAMIN D 2000 UNITS PO TABS   Oral   Take 2,000 Units by mouth daily.         Marland Kitchen CLOPIDOGREL BISULFATE 75 MG PO TABS   Oral   Take 37.5 mg by mouth daily.         . DONEPEZIL HCL 5 MG PO TABS   Oral   Take 5 mg by mouth daily.         Marland Kitchen FAMOTIDINE 20 MG PO TABS   Oral   Take 20 mg by mouth 2 (two) times daily.         . IRON PO TABS   Oral   Take 1 tablet by mouth daily.         . MECLIZINE HCL 25 MG PO TABS   Oral   Take  12.5 mg by mouth 3 (three) times daily as needed.         . ADULT MULTIVITAMIN W/MINERALS CH   Oral   Take 1 tablet by mouth daily.         Marland Kitchen NITROGLYCERIN 0.4 MG/HR TD PT24   Transdermal   Place 1 patch onto the skin daily.         Marland Kitchen RALOXIFENE HCL 60 MG PO TABS   Oral   Take 60 mg by mouth daily.         Marland Kitchen ROSUVASTATIN CALCIUM 10 MG PO TABS   Oral   Take 10 mg by mouth daily.         Marland Kitchen METOPROLOL SUCCINATE ER 25 MG PO TB24   Oral   Take 25 mg by mouth daily.           BP 105/61  Pulse 78  Temp 97.6 F (36.4 C) (Oral)  Resp 18  Physical Exam  Nursing note and vitals reviewed. Constitutional: She is oriented to person, place, and time.       Elderly, sickly appearing female, in no distress  HENT:  Head: Normocephalic and atraumatic.  Eyes: Conjunctivae normal and EOM are normal. Pupils are equal, round, and reactive to light.  Neck: No tracheal deviation present.  Cardiovascular: Normal rate and regular rhythm.   Murmur heard. Pulmonary/Chest: Effort normal. No stridor. No respiratory distress.  Abdominal: Soft. She exhibits no distension.    Musculoskeletal:       No deformity  Neurological: She is alert and oriented to person, place, and time. She displays atrophy. No cranial nerve deficit. She exhibits abnormal muscle tone. Coordination normal.       The patient has 4/5 strength symmetrically in both lower extremities throughout.  Upper extremity strength is similar, symmetric, unchanged per the patient.  No gross discoordination, visual tracking is appropriate, no focal cranial nerve deficits    ED Course  Procedures (including critical care time)   Labs Reviewed  PROTIME-INR  APTT  CBC  DIFFERENTIAL  COMPREHENSIVE METABOLIC PANEL  TROPONIN I  URINALYSIS, ROUTINE W REFLEX MICROSCOPIC   No results found.   No diagnosis found.  Cardiac 80 sinus rhythm normal Pulse ox 99% room air normal   Date: 03/23/2012  Rate: 81  Rhythm: normal sinus rhythm  QRS Axis: left  Intervals: normal  ST/T Wave abnormalities: normal  Conduction Disutrbances:left anterior fascicular block  Narrative Interpretation:   Old EKG Reviewed: unchanged ABNORMAL  Update: The patient remains in no distress.  She is answering questions clearly.  Update: The patient is ambulatory.  She seems in no distress the  Update: I discussed all findings with patient and her daughter.  The patient is not tremulous, remains in no distress, he answers questions appropriately.  We discussed the possibility of TIA versus early infection.  The patient's daughter prefers that the patient be discharged, home with PMD followup on Monday, already scheduled.  We discussed all the possibilities, and given the consideration of this being TIA, though this seems unlikely, the patient will have full strength Plavix were stored pending discussion with her primary care physician.  MDM  This elderly female presents with a day of intermittent, and consistent neurologic changes.  On exam the patient is in no distress, has no discernible neurologic deficiencies.  Given the  patient's history of prior TIA, her comorbidities, there is some suspicion of TIA versus other acute cerebrovascular event, though this seems unlikely given the absence  of deficits on exam.  Additionally, the patient started taking Plavix, and the patient's daughter describes this phenomena wherein the patient makes medical pathology she was exposed to via television. Given the absence of acute findings, the patient's daughter preferred discharge with PMD followup.  This was discussed, the patient was discharged in stable condition, though with additional evaluation required.        Gerhard Munch, MD 03/23/12 2045

## 2012-03-23 NOTE — ED Notes (Signed)
Pt. Feeling shaky and weak.  Speech is clear answers questions appropriately, Pt. Is alert and oriented X4.     Pt. Is genera weak on both sides .

## 2012-03-23 NOTE — ED Notes (Signed)
Family reports that patient began C/O right side numbness beginning at 0900 today.  Family tested patient for stroke which she reports as normal. Patient continued to C/O numbness all day and was taken to an urgent care this PM.  Urgent care sent her to [facility] ED.  Patient is asymptomatic at this time.

## 2012-03-25 LAB — URINE CULTURE

## 2012-03-26 NOTE — ED Notes (Signed)
+   Urine Chart sent to EDP office for review. 

## 2012-03-28 NOTE — ED Notes (Signed)
Chart returned from EDP office.Rx for Cipro 250 mg BID x 3 days needs to be called to pharmacy.

## 2012-03-28 NOTE — ED Notes (Signed)
Cipro 250 mg BID x 3 days called [facility] at [phone] per D. Katrinka Blazing NP.

## 2012-04-06 ENCOUNTER — Inpatient Hospital Stay (HOSPITAL_COMMUNITY): Payer: Medicare Other

## 2012-04-06 ENCOUNTER — Inpatient Hospital Stay (HOSPITAL_COMMUNITY)
Admission: EM | Admit: 2012-04-06 | Discharge: 2012-04-08 | DRG: 066 | Disposition: A | Discharge status: Home / Self Care | Payer: Medicare Other | Attending: Internal Medicine | Admitting: Internal Medicine

## 2012-04-06 ENCOUNTER — Encounter (HOSPITAL_COMMUNITY): Payer: Self-pay | Admitting: *Deleted

## 2012-04-06 ENCOUNTER — Emergency Department (HOSPITAL_COMMUNITY): Payer: Medicare Other

## 2012-04-06 DIAGNOSIS — I672 Cerebral atherosclerosis: Secondary | ICD-10-CM | POA: Diagnosis present

## 2012-04-06 DIAGNOSIS — E7800 Pure hypercholesterolemia, unspecified: Secondary | ICD-10-CM

## 2012-04-06 DIAGNOSIS — R531 Weakness: Secondary | ICD-10-CM

## 2012-04-06 DIAGNOSIS — I6350 Cerebral infarction due to unspecified occlusion or stenosis of unspecified cerebral artery: Principal | ICD-10-CM | POA: Diagnosis present

## 2012-04-06 DIAGNOSIS — R5383 Other fatigue: Secondary | ICD-10-CM | POA: Diagnosis present

## 2012-04-06 DIAGNOSIS — G459 Transient cerebral ischemic attack, unspecified: Secondary | ICD-10-CM

## 2012-04-06 DIAGNOSIS — R5381 Other malaise: Secondary | ICD-10-CM | POA: Diagnosis present

## 2012-04-06 DIAGNOSIS — R4789 Other speech disturbances: Secondary | ICD-10-CM | POA: Diagnosis present

## 2012-04-06 DIAGNOSIS — F0390 Unspecified dementia without behavioral disturbance: Secondary | ICD-10-CM

## 2012-04-06 DIAGNOSIS — I2510 Atherosclerotic heart disease of native coronary artery without angina pectoris: Secondary | ICD-10-CM | POA: Diagnosis present

## 2012-04-06 DIAGNOSIS — I639 Cerebral infarction, unspecified: Secondary | ICD-10-CM | POA: Diagnosis present

## 2012-04-06 HISTORY — DX: Transient cerebral ischemic attack, unspecified: G45.9

## 2012-04-06 LAB — CBC
Platelets: 264 10*3/uL (ref 150–400)
Platelets: 245 10*3/uL (ref 150–400)
RBC: 3.76 MIL/uL — ABNORMAL LOW (ref 3.87–5.11)
RDW: 15 % (ref 11.5–15.5)
WBC: 6.6 10*3/uL (ref 4.0–10.5)
WBC: 7.6 10*3/uL (ref 4.0–10.5)

## 2012-04-06 LAB — GLUCOSE, CAPILLARY: Glucose-Capillary: 62 mg/dL — ABNORMAL LOW (ref 70–99)

## 2012-04-06 LAB — POCT I-STAT, CHEM 8
BUN: 10 mg/dL (ref 6–23)
Hemoglobin: 11.2 g/dL — ABNORMAL LOW (ref 12.0–15.0)
Potassium: 4.1 mEq/L (ref 3.5–5.1)
Sodium: 141 mEq/L (ref 135–145)
TCO2: 25 mmol/L (ref 0–100)

## 2012-04-06 LAB — DIFFERENTIAL
Basophils Relative: 0 % (ref 0–1)
Eosinophils Relative: 2 % (ref 0–5)
Monocytes Absolute: 0.6 10*3/uL (ref 0.1–1.0)
Monocytes Relative: 9 % (ref 3–12)
Neutro Abs: 4.5 10*3/uL (ref 1.7–7.7)

## 2012-04-06 LAB — TROPONIN I: Troponin I: <0.3 ng/mL (ref <0.30)

## 2012-04-06 LAB — COMPREHENSIVE METABOLIC PANEL
Albumin: 3 g/dL — ABNORMAL LOW (ref 3.5–5.2)
BUN: 12 mg/dL (ref 6–23)
Calcium: 8.6 mg/dL (ref 8.4–10.5)
Chloride: 103 mEq/L (ref 96–112)
Creatinine, Ser: 0.8 mg/dL (ref 0.50–1.10)
GFR calc non Af Amer: 63 mL/min — ABNORMAL LOW (ref >90)
Total Bilirubin: 0.4 mg/dL (ref 0.3–1.2)

## 2012-04-06 LAB — APTT: aPTT: 40 seconds — ABNORMAL HIGH (ref 24–37)

## 2012-04-06 LAB — PROTIME-INR: Prothrombin Time: 13.8 seconds (ref 11.6–15.2)

## 2012-04-06 MED ORDER — ACETAMINOPHEN 325 MG PO TABS: 650.0000 mg | ORAL_TABLET | ORAL | Status: DC | PRN

## 2012-04-06 MED ORDER — SODIUM CHLORIDE 0.9 % IV SOLN: 250.0000 mL | INTRAVENOUS | Status: DC | PRN

## 2012-04-06 MED ORDER — ATORVASTATIN CALCIUM 10 MG PO TABS
10.0000 mg | ORAL_TABLET | Freq: Every day | ORAL | Status: DC
  Administered 2012-04-07: 10 mg via ORAL
  Filled 2012-04-06 (×2): qty 1

## 2012-04-06 MED ORDER — NITROGLYCERIN 0.4 MG/HR TD PT24
0.4000 mg | MEDICATED_PATCH | Freq: Every day | TRANSDERMAL | Status: DC
Start: 2012-04-06 — End: 2012-04-08
  Administered 2012-04-06 – 2012-04-08 (×3): 0.4 mg via TRANSDERMAL
  Filled 2012-04-06 (×3): qty 1

## 2012-04-06 MED ORDER — SODIUM CHLORIDE 0.9 % IJ SOLN: 3.0000 mL | INTRAMUSCULAR | Status: DC | PRN

## 2012-04-06 MED ORDER — RALOXIFENE HCL 60 MG PO TABS
60.0000 mg | ORAL_TABLET | Freq: Every day | ORAL | Status: DC
  Administered 2012-04-06 – 2012-04-07 (×2): 60 mg via ORAL
  Filled 2012-04-06 (×3): qty 1

## 2012-04-06 MED ORDER — MIRTAZAPINE 7.5 MG PO TABS
7.5000 mg | ORAL_TABLET | Freq: Every day | ORAL | Status: DC
  Administered 2012-04-06 – 2012-04-07 (×2): 7.5 mg via ORAL
  Filled 2012-04-06 (×3): qty 1

## 2012-04-06 MED ORDER — FAMOTIDINE 20 MG PO TABS
20.0000 mg | ORAL_TABLET | Freq: Two times a day (BID) | ORAL | Status: DC
  Administered 2012-04-06 – 2012-04-08 (×4): 20 mg via ORAL
  Filled 2012-04-06 (×5): qty 1

## 2012-04-06 MED ORDER — SODIUM CHLORIDE 0.9 % IJ SOLN
3.0000 mL | Freq: Two times a day (BID) | INTRAMUSCULAR | Status: DC
  Administered 2012-04-06 – 2012-04-08 (×4): 3 mL via INTRAVENOUS

## 2012-04-06 MED ORDER — CLOPIDOGREL BISULFATE 75 MG PO TABS
75.0000 mg | ORAL_TABLET | Freq: Every day | ORAL | Status: DC
  Administered 2012-04-07 – 2012-04-08 (×3): 75 mg via ORAL
  Filled 2012-04-06 (×2): qty 1

## 2012-04-06 MED ORDER — SODIUM CHLORIDE 0.9 % IV SOLN
INTRAVENOUS | Status: DC
  Administered 2012-04-06: 14:00:00 via INTRAVENOUS

## 2012-04-06 MED ORDER — DONEPEZIL HCL 10 MG PO TABS
10.0000 mg | ORAL_TABLET | Freq: Every day | ORAL | Status: DC
  Administered 2012-04-06 – 2012-04-07 (×2): 10 mg via ORAL
  Filled 2012-04-06 (×3): qty 1

## 2012-04-06 NOTE — ED Notes (Signed)
No distress noted.  Resp symmetrical and unlabored.

## 2012-04-06 NOTE — ED Provider Notes (Signed)
History     CSN: [id number]  Arrival date & time 04/06/12  1250   First MD Initiated Contact with Patient 04/06/12 1302    Chief complaint weakness, difficulty speaking.  No chief complaint on file.  level V caveat, dementia history is obtained from patient's daughter  (Consider location/radiation/quality/duration/timing/severity/associated sxs/prior treatment) HPI Develop difficulty speaking and right arm weakness onset1 40 p.m. today. Symptoms have resolved since arrival here. Symptoms similar to TIAs she has had in the past. No treatment prior to coming here. Past Medical History  Diagnosis Date  . Coronary artery disease   . High cholesterol   . Dementia     Past Surgical History  Procedure Date  . Coronary angioplasty with stent placement     No family history on file.  History  Substance Use Topics  . Smoking status: Never Smoker   . Smokeless tobacco: Not on file  . Alcohol Use: No    OB History    Grav Para Term Preterm Abortions TAB SAB Ect Mult Living                  Review of Systems  Unable to perform ROS: Dementia  Neurological: Positive for weakness.       Difficulty speaking, right arm weakness    Allergies  Review of patient's allergies indicates no known allergies.  Home Medications   Current Outpatient Rx  Name  Route  Sig  Dispense  Refill  . CALCIUM-VITAMIN D 600-200 MG-UNIT PO CAPS   Oral   Take by mouth.         . [facility] COLD PLUS PO   Oral   Take 2 capsules by mouth once as needed. For sneezing episode         . VITAMIN D 2000 UNITS PO TABS   Oral   Take 2,000 Units by mouth daily.         Marland Kitchen CLOPIDOGREL BISULFATE 75 MG PO TABS   Oral   Take 1 tablet (75 mg total) by mouth daily.   30 tablet   0   . DONEPEZIL HCL 5 MG PO TABS   Oral   Take 5 mg by mouth daily.         Marland Kitchen FAMOTIDINE 20 MG PO TABS   Oral   Take 20 mg by mouth 2 (two) times daily.         . IRON PO TABS   Oral   Take 1 tablet by mouth daily.         Marland Kitchen MECLIZINE HCL 25 MG PO TABS   Oral   Take 12.5 mg by mouth 3 (three) times daily as needed.         Marland Kitchen METOPROLOL SUCCINATE ER 25 MG PO TB24   Oral   Take 25 mg by mouth daily.         . ADULT MULTIVITAMIN W/MINERALS CH   Oral   Take 1 tablet by mouth daily.         Marland Kitchen NITROGLYCERIN 0.4 MG/HR TD PT24   Transdermal   Place 1 patch onto the skin daily.         Marland Kitchen RALOXIFENE HCL 60 MG PO TABS   Oral   Take 60 mg by mouth daily.         Marland Kitchen ROSUVASTATIN CALCIUM 10 MG PO TABS   Oral   Take 10 mg by mouth daily.           There  were no vitals taken for this visit.  Physical Exam  Nursing note and vitals reviewed. Constitutional: She appears well-developed and well-nourished.  HENT:  Head: Normocephalic and atraumatic.       No facial asymmetry  Eyes: Conjunctivae normal are normal. Pupils are equal, round, and reactive to light.  Neck: Neck supple. No tracheal deviation present. No thyromegaly present.  Cardiovascular: Normal rate and regular rhythm.   No murmur heard. Pulmonary/Chest: Effort normal and breath sounds normal.  Abdominal: Soft. Bowel sounds are normal. She exhibits no distension. There is no tenderness.  Musculoskeletal: Normal range of motion. She exhibits no edema and no tenderness.  Neurological: She is alert. Coordination normal.       Follow simple commands so it started while motor strength 5 over 5 overall speech is clear  Skin: Skin is warm and dry. No rash noted.  Psychiatric: She has a normal mood and affect.    ED Course  Procedures (including critical care time) Code stroke called from triage. Dr. Roseanne Reno came to evaluate patient in ED  Labs Reviewed  PROTIME-INR  APTT  CBC  DIFFERENTIAL  COMPREHENSIVE METABOLIC PANEL  TROPONIN I   Ct Head Wo Contrast  04/06/2012  *RADIOLOGY REPORT*  Clinical Data: Slurred speech.  Right-sided weakness and numbness. Code stroke  CT HEAD WITHOUT CONTRAST  Technique:  Contiguous axial  images were obtained from the base of the skull through the vertex without contrast.  Comparison: CT head 03/23/2012  Findings: Image quality degraded by moderate motion.  Multiple images were repeated.  Generalized atrophy.  Chronic microvascular ischemia in the white matter.  No acute infarct is identified.  Negative for hemorrhage or mass lesion.  Calvarium intact.  IMPRESSION: Atrophy and chronic microvascular ischemia.  No acute abnormality.  Considerable motion on the study degrades image quality.  Critical Value/emergent results were called by telephone at the time of interpretation on 04/06/2012 at 1350 hours  to Dr. Lu Duffel, who verbally acknowledged these results.   Original Report Authenticated By: Janeece Riggers, M.D.    Results for orders placed during the hospital encounter of 04/06/12  PROTIME-INR      Component Value Range   Prothrombin Time 13.8  11.6 - 15.2 seconds   INR 1.07  0.00 - 1.49  APTT      Component Value Range   aPTT 40 (*) 24 - 37 seconds  CBC      Component Value Range   WBC 6.6  4.0 - 10.5 K/uL   RBC 3.85 (*) 3.87 - 5.11 MIL/uL   Hemoglobin 10.9 (*) 12.0 - 15.0 g/dL   HCT 16.1 (*) 09.6 - 04.5 %   MCV 87.0  78.0 - 100.0 fL   MCH 28.3  26.0 - 34.0 pg   MCHC 32.5  30.0 - 36.0 g/dL   RDW 40.9  81.1 - 91.4 %   Platelets 264  150 - 400 K/uL  DIFFERENTIAL      Component Value Range   Neutrophils Relative 68  43 - 77 %   Neutro Abs 4.5  1.7 - 7.7 K/uL   Lymphocytes Relative 21  12 - 46 %   Lymphs Abs 1.4  0.7 - 4.0 K/uL   Monocytes Relative 9  3 - 12 %   Monocytes Absolute 0.6  0.1 - 1.0 K/uL   Eosinophils Relative 2  0 - 5 %   Eosinophils Absolute 0.1  0.0 - 0.7 K/uL   Basophils Relative 0  0 - 1 %  Basophils Absolute 0.0  0.0 - 0.1 K/uL  COMPREHENSIVE METABOLIC PANEL      Component Value Range   Sodium 139  135 - 145 mEq/L   Potassium 4.3  3.5 - 5.1 mEq/L   Chloride 103  96 - 112 mEq/L   CO2 24  19 - 32 mEq/L   Glucose, Bld 105 (*) 70 - 99 mg/dL   BUN 12   6 - 23 mg/dL   Creatinine, Ser 9.14  0.50 - 1.10 mg/dL   Calcium 8.6  8.4 - 78.2 mg/dL   Total Protein 6.2  6.0 - 8.3 g/dL   Albumin 3.0 (*) 3.5 - 5.2 g/dL   AST 27  0 - 37 U/L   ALT 10  0 - 35 U/L   Alkaline Phosphatase 56  39 - 117 U/L   Total Bilirubin 0.4  0.3 - 1.2 mg/dL   GFR calc non Af Amer 63 (*) >90 mL/min   GFR calc Af Amer 72 (*) >90 mL/min  TROPONIN I      Component Value Range   Troponin I <0.30  <0.30 ng/mL  POCT I-STAT, CHEM 8      Component Value Range   Sodium 141  135 - 145 mEq/L   Potassium 4.1  3.5 - 5.1 mEq/L   Chloride 106  96 - 112 mEq/L   BUN 10  6 - 23 mg/dL   Creatinine, Ser 9.56  0.50 - 1.10 mg/dL   Glucose, Bld 92  70 - 99 mg/dL   Calcium, Ion 2.13 (*) 1.13 - 1.30 mmol/L   TCO2 25  0 - 100 mmol/L   Hemoglobin 11.2 (*) 12.0 - 15.0 g/dL   HCT 08.6 (*) 57.8 - 46.9 %   Ct Head Wo Contrast  04/06/2012  *RADIOLOGY REPORT*  Clinical Data: Slurred speech.  Right-sided weakness and numbness. Code stroke  CT HEAD WITHOUT CONTRAST  Technique:  Contiguous axial images were obtained from the base of the skull through the vertex without contrast.  Comparison: CT head 03/23/2012  Findings: Image quality degraded by moderate motion.  Multiple images were repeated.  Generalized atrophy.  Chronic microvascular ischemia in the white matter.  No acute infarct is identified.  Negative for hemorrhage or mass lesion.  Calvarium intact.  IMPRESSION: Atrophy and chronic microvascular ischemia.  No acute abnormality.  Considerable motion on the study degrades image quality.  Critical Value/emergent results were called by telephone at the time of interpretation on 04/06/2012 at 1350 hours  to Dr. Lu Duffel, who verbally acknowledged these results.   Original Report Authenticated By: Janeece Riggers, M.D.    Ct Head Wo Contrast  03/23/2012  *RADIOLOGY REPORT*  Clinical Data: Weakness  CT HEAD WITHOUT CONTRAST  Technique:  Contiguous axial images were obtained from the base of the skull  through the vertex without contrast.  Comparison: 08/24/2010  Findings: No skull fracture is noted.  Atherosclerotic calcifications of carotid siphon.  Paranasal sinuses and mastoid air cells are unremarkable.  Stable cerebral atrophy.  Again noted periventricular and subcortical chronic white matter disease.  No acute cortical infarction.  No mass lesion is noted on this unenhanced scan.  Ventricular size is stable from prior exam.  No intracranial hemorrhage, mass effect or midline shift.  IMPRESSION: No acute intracranial abnormality.  Stable atrophy and chronic white matter disease.  No definite acute cortical infarction.   Original Report Authenticated By: Natasha Mead, M.D.      No diagnosis found. EKG shows normal sinus rhythm  70 beats per minute left axis deviation intervals normal conduction delay poor R. progression nonspecific T wave changes, no significant change from 03/23/2012 as interpreted by me  Medical decision making: Per Dr. Roseanne Reno patient should be placed in hospital for a stroke workup. Full dose Plavix started. Patient's daughter reports that she took 75 mg Plavix earlier today. Spoke with Ms. black from [facility] service plan 23 hour observation telemetry Diagnosis #1ansient ischemic attack #2 anemia  Doug Sou, MD 04/06/12 1538

## 2012-04-06 NOTE — H&P (Addendum)
[facility] History and Physical  Angela Mcintosh ZOX:[id number] DOB: [birth year] DOA: 04/06/2012  Referring physician:  PCP: Robynn Pane, MD  Specialists:   Chief Complaint: right sided weakness, slurred speech  HPI: Angela Mcintosh is a very pleasant  79 y.o. female hx CAD, dementia, TIA presents to [place] cc right sided weakness and slurred speech. Information provided by daughter who is primary caregiver and with the patient when symptoms occurred as pt somewhat demented and information from her may not be reliable. Daughter reports pt waited in car while she went into store. When she came back to car 10 minutes later pt tried to speak but speech incomprehensible and pt unable to move right arm. Pt brought to ED and code stroke called. By the time CT complete symptoms resolved. Daughter reports similar episode on 2 other occasions over last 7 days. Daughter reports pt meclizine discontinued recently and plavix cut to 1/2 pill due to upset stomach but last 2 days has taken whole plavix. Symptoms came on suddenly, resolved characterized as severe. Pt denies any memory of event and daughter reports short term memory weak. No reports of recent falls. Pt denies pain/discomfort, CP palpitation, sob. Denies any fever chills, cough recent sick contacts. In ED pt back to baseline. CT head atrophy and chronic microvascular ischemia. No acute abnormality. We are asked to admit.   Review of Systems: The patient denies anorexia, fever, weight loss,, vision loss,  hoarseness, chest pain, syncope, dyspnea on exertion, peripheral edema, balance deficits, hemoptysis, abdominal pain, melena, hematochezia, severe indigestion/heartburn, hematuria, incontinence, genital sores, muscle weakness, suspicious skin lesions, transient blindness, difficulty walking, depression, unusual weight change, abnormal bleeding, enlarged lymph nodes, angioedema, and breast masses.    Past Medical History  Diagnosis Date   . Coronary artery disease   . High cholesterol   . Dementia   . TIA (transient ischemic attack)    Past Surgical History  Procedure Date  . Coronary angioplasty with stent placement    Social History:  reports that she has never smoked. She does not have any smokeless tobacco history on file. She reports that she does not drink alcohol or use illicit drugs. Lives with daughter. Minimal assistance required. Able to make needs known, minor wandering, some sundowners  No family history on file. Family history reviewed. Mother HTN Father HTN and diabetes  Prior to Admission medications   Medication Sig Start Date End Date Taking? Authorizing Provider  Calcium Carbonate-Vitamin D (CALCIUM-VITAMIN D) 600-200 MG-UNIT CAPS Take by mouth.   Yes Historical Provider, MD  Cholecalciferol (VITAMIN D) 2000 UNITS tablet Take 2,000 Units by mouth daily.   Yes Historical Provider, MD  clopidogrel (PLAVIX) 75 MG tablet Take 75 mg by mouth daily. 03/23/12  Yes Gerhard Munch, MD  donepezil (ARICEPT) 10 MG tablet Take 10 mg by mouth at bedtime.   Yes Historical Provider, MD  famotidine (PEPCID) 20 MG tablet Take 20 mg by mouth 2 (two) times daily.   Yes Historical Provider, MD  Iron TABS Take 1 tablet by mouth daily.   Yes Historical Provider, MD  mirtazapine (REMERON) 7.5 MG tablet Take 7.5 mg by mouth at bedtime.   Yes Historical Provider, MD  Multiple Vitamin (MULTIVITAMIN WITH MINERALS) TABS Take 1 tablet by mouth daily.   Yes Historical Provider, MD  nitroGLYCERIN (NITRODUR - DOSED IN MG/24 HR) 0.4 mg/hr Place 1 patch onto the skin daily.   Yes Historical Provider, MD  raloxifene (EVISTA) 60 MG tablet Take 60 mg  by mouth at bedtime.    Yes Historical Provider, MD  rosuvastatin (CRESTOR) 10 MG tablet Take 10 mg by mouth daily.   Yes Historical Provider, MD   Physical Exam: Filed Vitals:   04/06/12 1357 04/06/12 1400 04/06/12 1500  BP: 106/59 100/62 116/70  Pulse:  75 74  Resp: 14 20 17   SpO2: 98%  98% 98%     General:  Thin alert NAD  Eyes: PERRL EOMI   ENT: ears clear, nose without drainage, mucus membrane mouth moist pink  Neck: supple no JVD  Cardiovascular: RRR No MGR No LEE  Respiratory: normal effort BSCTAB no wheeze  Abdomen: flat soft +BS non-tender to palpation  Skin: warm dry no rash or lesion  Musculoskeletal: MAE no joint swelling/erythema  Psychiatric: calm, cooperative slight irritabilty  Neurologic: alert oriented to self and place.   Cranial nerve II-XII intact speech clear, motor 3+/5 in RLE,  Rest 5/5  Sensory: light touch and pin prick intact, DTR 1 plus b/l  Plantars mute bilaterally  Labs on Admission:  Basic Metabolic Panel:  Lab 04/06/12 1191 04/06/12 1259  NA 141 139  K 4.1 4.3  CL 106 103  CO2 -- 24  GLUCOSE 92 105*  BUN 10 12  CREATININE 0.90 0.80  CALCIUM -- 8.6  MG -- --  PHOS -- --   Liver Function Tests:  Lab 04/06/12 1259  AST 27  ALT 10  ALKPHOS 56  BILITOT 0.4  PROT 6.2  ALBUMIN 3.0*   No results found for this basename: LIPASE:5,AMYLASE:5 in the last 168 hours No results found for this basename: AMMONIA:5 in the last 168 hours CBC:  Lab 04/06/12 1535 04/06/12 1318 04/06/12 1259  WBC 7.6 -- 6.6  NEUTROABS -- -- 4.5  HGB 10.6* 11.2* 10.9*  HCT 33.0* 33.0* 33.5*  MCV 87.8 -- 87.0  PLT 245 -- 264   Cardiac Enzymes:  Lab 04/06/12 1259  CKTOTAL --  CKMB --  CKMBINDEX --  TROPONINI <0.30    BNP (last 3 results) No results found for this basename: PROBNP:3 in the last 8760 hours CBG: No results found for this basename: GLUCAP:5 in the last 168 hours  Radiological Exams on Admission: Ct Head Wo Contrast  04/06/2012  *RADIOLOGY REPORT*  Clinical Data: Slurred speech.  Right-sided weakness and numbness. Code stroke  CT HEAD WITHOUT CONTRAST  Technique:  Contiguous axial images were obtained from the base of the skull through the vertex without contrast.  Comparison: CT head 03/23/2012  Findings: Image  quality degraded by moderate motion.  Multiple images were repeated.  Generalized atrophy.  Chronic microvascular ischemia in the white matter.  No acute infarct is identified.  Negative for hemorrhage or mass lesion.  Calvarium intact.  IMPRESSION: Atrophy and chronic microvascular ischemia.  No acute abnormality.  Considerable motion on the study degrades image quality.  Critical Value/emergent results were called by telephone at the time of interpretation on 04/06/2012 at 1350 hours  to Dr. Lu Duffel, who verbally acknowledged these results.   Original Report Authenticated By: Janeece Riggers, M.D.     EKG: Independently reviewed. NSR  Assessment/Plan Principal Problem:  *Right sided weakness: likely TIA.will admit to tele for TIA work up. Will get carotid dopplar, echo, cycle ce, MRI/MRA of brain.  Check Hg A1c, FLP, continue plavix and statin. Pt with history of same. PT/OT.  Neuro assistance appreciated.  Active Problems:  High cholesterol: will check lipid panel. Continue statin   Dementia: at baseline. Continue aricept  Coronary artery disease: stable at baseline. No chest pain. Monitor on tele. Continue home meds   TIA (transient ischemic attack): history. See #1.   Dr. Roseanne Reno with neuro  Code Status: NCB Family Communication: daughter at bedside Disposition Plan: home with daughter when stable Time spent: 35 minutes  [facility]  Pt seen and examined, agree with note above by Toya Smothers, NP 89/F with CAD on Plavix admitted with neurological symptoms off and on for 1 week, today with slurred speech and R sided weakness Admit to tele, CVA/TIA workup, Neuro consulted DNR confirmed with daughter Zannie Cove, MD [phone]

## 2012-04-06 NOTE — ED Notes (Signed)
Per daughter pt was with her in the car and the daughter noticed that she slumped over to the R side and she was then found to be slurring her speech and was unable to raise her R arm.  Pt reportedly had a similar episode 2 weeks ago.  On arrival to the ER pt was able to move all extremetes and she has clear speech but states that it feels that her R side of her body is "falling asleep".  Resp symmetrical and unlabored.

## 2012-04-06 NOTE — Consult Note (Signed)
Referring Physician: Ethelda Chick    Chief Complaint: TIA  HPI:                                                                                                                                         Angela Mcintosh is an 79 y.o. female who was recently seen in ED 03/30/2012 for similar symptoms of right sided weakness and numbness. Today patient was shopping with her daughter when she had a sudden onset of expressive aphasia and right UE weakness. Patient was brought to ED and Code stoke was initiated.  By the time she had finished CT head her symptoms had resolved. Daughter states 2 weeks ago she had to decrease patients Plavix to half a pill due to stomach upset but just over past two days she as been taking a full Plavix. At time of Examination patient scored a 2 for wrong month and getting her age wrong, other wise patient was back to baseline.   LSN: 12:30 tPA Given: No: resolution of symptoms  Past Medical History  Diagnosis Date  . Coronary artery disease   . High cholesterol   . Dementia     Past Surgical History  Procedure Date  . Coronary angioplasty with stent placement     Family history : Mother: HTN Father: HTN and DM  Social History:  reports that she has never smoked. She does not have any smokeless tobacco history on file. She reports that she does not drink alcohol or use illicit drugs.  Allergies: No Known Allergies  Medications:                                                                                                                           No current facility-administered medications for this encounter.   Current Outpatient Prescriptions  Medication Sig Dispense Refill  . Calcium Carbonate-Vitamin D (CALCIUM-VITAMIN D) 600-200 MG-UNIT CAPS Take by mouth.      . Chlorphen-Pseudoephed-APAP ([facility] COLD PLUS PO) Take 2 capsules by mouth once as needed. For sneezing episode      . Cholecalciferol (VITAMIN D) 2000 UNITS tablet Take 2,000 Units by mouth  daily.      . clopidogrel (PLAVIX) 75 MG tablet Take 1 tablet (75 mg total) by mouth daily.  30 tablet  0  . donepezil (ARICEPT) 5 MG tablet Take 5 mg by mouth daily.      Marland Kitchen  famotidine (PEPCID) 20 MG tablet Take 20 mg by mouth 2 (two) times daily.      . Iron TABS Take 1 tablet by mouth daily.      . meclizine (ANTIVERT) 25 MG tablet Take 12.5 mg by mouth 3 (three) times daily as needed.      . metoprolol succinate (TOPROL-XL) 25 MG 24 hr tablet Take 25 mg by mouth daily.      . Multiple Vitamin (MULTIVITAMIN WITH MINERALS) TABS Take 1 tablet by mouth daily.      . nitroGLYCERIN (NITRODUR - DOSED IN MG/24 HR) 0.4 mg/hr Place 1 patch onto the skin daily.      . raloxifene (EVISTA) 60 MG tablet Take 60 mg by mouth daily.      . rosuvastatin (CRESTOR) 10 MG tablet Take 10 mg by mouth daily.        ROS:                                                                                                                                       History obtained from daughter  General ROS: negative for - chills, fatigue, fever, night sweats, weight gain or weight loss Psychological ROS: positive for - Dementia Ophthalmic ROS: negative for - blurry vision, double vision, eye pain or loss of vision ENT ROS: negative for - epistaxis, nasal discharge, oral lesions, sore throat, tinnitus or vertigo Allergy and Immunology ROS: negative for - hives or itchy/watery eyes Hematological and Lymphatic ROS: negative for - bleeding problems, bruising or swollen lymph nodes Endocrine ROS: negative for - galactorrhea, hair pattern changes, polydipsia/polyuria or temperature intolerance Respiratory ROS: negative for - cough, hemoptysis, shortness of breath or wheezing Cardiovascular ROS: negative for - chest pain, dyspnea on exertion, edema or irregular heartbeat Gastrointestinal ROS: negative for - abdominal pain, diarrhea, hematemesis, nausea/vomiting or stool incontinence Genito-Urinary ROS: negative for - dysuria,  hematuria, incontinence or urinary frequency/urgency Musculoskeletal ROS: negative for - joint swelling or muscular weakness Neurological ROS: as noted in HPI Dermatological ROS: negative for rash and skin lesion changes  Neurologic Examination:                                                                                                      There were no vitals taken for this visit.  Mental Status: Alert, oriented to person, stated her age was 76 but has known dementia.  Hard of hearing but able to follow commands well when she was able to hear  instructions. Speech fluent without evidence of aphasia.   Cranial Nerves: II: Discs flat bilaterally; Visual fields grossly normal, pupils equal, round, reactive to light and accommodation III,IV, VI: ptosis not present, extra-ocular motions intact bilaterally V,VII: smile symmetric, facial light touch sensation normal bilaterally VIII: hearing normal bilaterally IX,X: gag reflex present XI: bilateral shoulder shrug XII: midline tongue extension Motor: Right : Upper extremity   5/5    Left:     Upper extremity   5/5  Lower extremity   5/5     Lower extremity   5/5 Tone and bulk:normal tone throughout; no atrophy noted Sensory: Pinprick and light touch intact throughout, bilaterally Deep Tendon Reflexes: 2+ bilateral UE, 1+ bilateral KJ and no AJ.  Plantars: Right: downgoing   Left: downgoing Cerebellar: normal finger-to-nose,  normal heel-to-shin test CV: pulses palpable throughout     No results found for this or any previous visit (from the past 48 hour(s)). Ct Head Wo Contrast  04/06/2012  *RADIOLOGY REPORT*  Clinical Data: Slurred speech.  Right-sided weakness and numbness. Code stroke  CT HEAD WITHOUT CONTRAST  Technique:  Contiguous axial images were obtained from the base of the skull through the vertex without contrast.  Comparison: CT head 03/23/2012  Findings: Image quality degraded by moderate motion.  Multiple images were  repeated.  Generalized atrophy.  Chronic microvascular ischemia in the white matter.  No acute infarct is identified.  Negative for hemorrhage or mass lesion.  Calvarium intact.  IMPRESSION: Atrophy and chronic microvascular ischemia.  No acute abnormality.  Considerable motion on the study degrades image quality.  Critical Value/emergent results were called by telephone at the time of interpretation on 04/06/2012 at 1350 hours  to Dr. Lu Duffel, who verbally acknowledged these results.   Original Report Authenticated By: Janeece Riggers, M.D.     Assessment and plan discussed with with attending physician and they are in agreement.    Felicie Morn PA-C [facility] [phone]  04/06/2012, 1:21 PM   Assessment: 79 y.o. female presenting to the ED with her second episode of transient right UE weakness in the past two weeks.  At present time symptoms have fully resolved.  Likely TIA.  Given multiple episodes and no recent stroke work up, would recommend patient be admitted and have full stroke workup including MRI/MRA head, Carotid doppler, 2 D echo, FLP and A1c.  Patient recently was on 37.5 mg Plavix and just started back on full dose Plavix two days ago--would continue full dose Plavix for now.   Stroke Risk Factors - hyperlipidemia and TIA  Plan: 1. HgbA1c, fasting lipid panel 2. MRI, MRA  of the brain without contrast 3. PT consult, OT consult, Speech consult 4. Echocardiogram 5. Carotid dopplers 6. Prophylactic therapy-Antiplatelet med: Plavix - dose 75 mg daily 7. Risk factor modification 8. Telemetry monitoring 9. Frequent neuro checks  Felicie Morn PA-C [facility]   I personally evaluated this patient and as needed in clinical assessment as well as formulation of management recommendations.  Venetia Maxon M.D. [facility] [phone]

## 2012-04-07 ENCOUNTER — Encounter (HOSPITAL_COMMUNITY): Payer: Self-pay | Admitting: Neurology

## 2012-04-07 DIAGNOSIS — M6281 Muscle weakness (generalized): Secondary | ICD-10-CM

## 2012-04-07 DIAGNOSIS — I6350 Cerebral infarction due to unspecified occlusion or stenosis of unspecified cerebral artery: Principal | ICD-10-CM

## 2012-04-07 DIAGNOSIS — I2510 Atherosclerotic heart disease of native coronary artery without angina pectoris: Secondary | ICD-10-CM

## 2012-04-07 DIAGNOSIS — I639 Cerebral infarction, unspecified: Secondary | ICD-10-CM | POA: Diagnosis present

## 2012-04-07 LAB — LIPID PANEL
HDL: 57 mg/dL (ref >39)
LDL Cholesterol: 73 mg/dL (ref 0–99)
Triglycerides: 83 mg/dL (ref <150)

## 2012-04-07 LAB — GLUCOSE, CAPILLARY
Glucose-Capillary: 75 mg/dL (ref 70–99)
Glucose-Capillary: 146 mg/dL — ABNORMAL HIGH (ref 70–99)

## 2012-04-07 LAB — HEMOGLOBIN A1C: Hgb A1c MFr Bld: 5.7 % — ABNORMAL HIGH (ref <5.7)

## 2012-04-07 NOTE — Evaluation (Signed)
Physical Therapy Evaluation Patient Details Name: Angela Mcintosh MRN: [id number] DOB: 11-Feb-1919 Today's Date: 04/07/2012 Time: 4098-1191 PT Time Calculation (min): 29 min  PT Assessment / Plan / Recommendation Clinical Impression  PT indicated to maximize functional mobility to ensure safe d/c home with daughter.    PT Assessment  Patient needs continued PT services    Follow Up Recommendations  Home health PT    Does the patient have the potential to tolerate intense rehabilitation      Barriers to Discharge None      Equipment Recommendations  None recommended by PT    Recommendations for Other Services     Frequency Min 4X/week    Precautions / Restrictions     Pertinent Vitals/Pain 0/10      Mobility  Bed Mobility Bed Mobility: Supine to Sit Supine to Sit: 4: Min assist Transfers Transfers: Sit to Stand;Stand to Sit Sit to Stand: 4: Min assist;From bed;From chair/3-in-1;With upper extremity assist Stand to Sit: 4: Min guard;To chair/3-in-1 Details for Transfer Assistance: verbal cues for hand placement Ambulation/Gait Ambulation/Gait Assistance: 4: Min assist Ambulation Distance (Feet): 7 Feet Assistive device: Rolling walker Gait Pattern: Decreased stride length Gait velocity: decreased    Exercises     PT Diagnosis: Difficulty walking  PT Problem List:   PT Treatment Interventions: DME instruction;Gait training;Stair training;Functional mobility training;Therapeutic activities;Balance training;Patient/family education   PT Goals Acute Rehab PT Goals PT Goal Formulation: With patient/family Time For Goal Achievement: 04/07/12 Potential to Achieve Goals: Good Pt will go Supine/Side to Sit: with modified independence;with HOB 0 degrees PT Goal: Supine/Side to Sit - Progress: Goal set today Pt will go Sit to Supine/Side: with modified independence PT Goal: Sit to Supine/Side - Progress: Goal set today Pt will go Sit to Stand: with supervision;with  upper extremity assist PT Goal: Sit to Stand - Progress: Goal set today Pt will go Stand to Sit: with supervision;with upper extremity assist PT Goal: Stand to Sit - Progress: Goal set today Pt will Ambulate: 51 - 150 feet;with supervision;with rolling walker PT Goal: Ambulate - Progress: Goal set today  Visit Information  Last PT Received On: 04/07/12 Assistance Needed: +1    Subjective Data  Subjective: "I am OK." Patient Stated Goal: home   Prior Functioning  Home Living Lives With: Daughter Available Help at Discharge: Family Type of Home: House Home Access: Ramped entrance Home Layout: One level Home Adaptive Equipment: Walker - rolling;Wheelchair - Doctor, general practice Prior Function Level of Independence: Independent with assistive device(s) Able to Take Stairs?: Yes Driving: No Vocation: Retired Musician: No difficulties    Copywriter, advertising Overall Cognitive Status: Appears within functional limits for tasks assessed/performed Arousal/Alertness: Awake/alert Orientation Level: Appears intact for tasks assessed Behavior During Session: [facility] for tasks performed    Extremity/Trunk Assessment Right Lower Extremity Assessment RLE ROM/Strength/Tone: [facility] for tasks assessed RLE Sensation: Deficits RLE Sensation Deficits: decreased light touch below knee Left Lower Extremity Assessment LLE ROM/Strength/Tone: WFL for tasks assessed LLE Sensation: WFL - Light Touch   Balance    End of Session PT - End of Session Equipment Utilized During Treatment: Gait belt Activity Tolerance: Patient tolerated treatment well Patient left: in chair;with call bell/phone within reach;with family/visitor present  GP     Ilda Foil 04/07/2012, 2:11 PM  Aida Raider, PT  Office # [phone] Pager [phone]

## 2012-04-07 NOTE — Progress Notes (Signed)
Stroke Team Progress Note  HISTORY  Angela Mcintosh is an 79 y.o. female who was recently seen in ED 03/30/2012 for similar symptoms of right sided weakness and numbness. Today patient was shopping with her daughter when she had a sudden onset of expressive aphasia and right UE weakness. Patient was brought to ED and Code stoke was initiated. By the time she had finished CT head her symptoms had resolved. Daughter states 2 weeks ago she had to decrease patients Plavix to half a pill due to stomach upset but just over past two days she as been taking a full Plavix. At time of Examination patient scored a 2 for wrong month and getting her age wrong, other wise patient was back to baseline.     SUBJECTIVE The patient is alert and cooperative at the time of the examination. The patient is in the room with her daughter. The daughter indicates that the patient is at or near baseline. At home, the patient handles all of her activities of daily living, and she walks with a cane or a walker.    OBJECTIVE Most recent Vital Signs: Temp: 98.2 F (36.8 C) (02/08 0949) Temp src: Oral (02/08 0700) BP: 111/60 mmHg (02/08 0949) Pulse Rate: 88 (02/08 0949) Respiratory Rate: 20 O2 Saturation: 97%  CBG (last 3)  Recent Labs  04/06/12 2245 04/07/12 0655  GLUCAP 62* 75   Intake/Output from previous day:    IV Fluid Intake:    Medications  . atorvastatin  10 mg Oral q1800  . clopidogrel  75 mg Oral Daily  . donepezil  10 mg Oral QHS  . famotidine  20 mg Oral BID  . mirtazapine  7.5 mg Oral QHS  . nitroGLYCERIN  0.4 mg Transdermal Daily  . raloxifene  60 mg Oral QHS  . sodium chloride  3 mL Intravenous Q12H  PRN sodium chloride, acetaminophen  Diet:  Cardiac thin and liquids Activity:  Up with assistance DVT Prophylaxis:  SCD  Significant Diagnostic Studies: CBC    Component Value Date/Time   WBC 7.6 04/06/2012 1535   RBC 3.76* 04/06/2012 1535   HGB 10.6* 04/06/2012 1535   HCT 33.0*  04/06/2012 1535   PLT 245 04/06/2012 1535   MCV 87.8 04/06/2012 1535   MCH 28.2 04/06/2012 1535   MCHC 32.1 04/06/2012 1535   RDW 15.1 04/06/2012 1535   LYMPHSABS 1.4 04/06/2012 1259   MONOABS 0.6 04/06/2012 1259   EOSABS 0.1 04/06/2012 1259   BASOSABS 0.0 04/06/2012 1259   CMP    Component Value Date/Time   NA 141 04/06/2012 1318   K 4.1 04/06/2012 1318   CL 106 04/06/2012 1318   CO2 24 04/06/2012 1259   GLUCOSE 92 04/06/2012 1318   BUN 10 04/06/2012 1318   CREATININE 0.90 04/06/2012 1318   CALCIUM 8.6 04/06/2012 1259   PROT 6.2 04/06/2012 1259   ALBUMIN 3.0* 04/06/2012 1259   AST 27 04/06/2012 1259   ALT 10 04/06/2012 1259   ALKPHOS 56 04/06/2012 1259   BILITOT 0.4 04/06/2012 1259   GFRNONAA 63* 04/06/2012 1259   GFRAA 72* 04/06/2012 1259   COAGS Lab Results  Component Value Date   INR 1.07 04/06/2012   INR 1.06 03/23/2012   INR 1.06 04/06/2009   Lipid Panel    Component Value Date/Time   CHOL 147 04/07/2012 0735   TRIG 83 04/07/2012 0735   HDL 57 04/07/2012 0735   CHOLHDL 2.6 04/07/2012 0735   VLDL 17 04/07/2012 0735  LDLCALC 73 04/07/2012 0735   HgbA1C  Lab Results  Component Value Date   HGBA1C 5.7* 04/06/2012   Urine Drug Screen  No results found for this basename: labopia, cocainscrnur, labbenz, amphetmu, thcu, labbarb    Alcohol Level No results found for this basename: eth     Results for orders placed during the hospital encounter of 04/06/12 (from the past 24 hour(s))  PROTIME-INR     Status: None   Collection Time    04/06/12 12:59 PM      Result Value Range   Prothrombin Time 13.8  11.6 - 15.2 seconds   INR 1.07  0.00 - 1.49  APTT     Status: Abnormal   Collection Time    04/06/12 12:59 PM      Result Value Range   aPTT 40 (*) 24 - 37 seconds  CBC     Status: Abnormal   Collection Time    04/06/12 12:59 PM      Result Value Range   WBC 6.6  4.0 - 10.5 K/uL   RBC 3.85 (*) 3.87 - 5.11 MIL/uL   Hemoglobin 10.9 (*) 12.0 - 15.0 g/dL   HCT 16.1 (*) 09.6 - 04.5 %   MCV 87.0  78.0 - 100.0 fL   MCH  28.3  26.0 - 34.0 pg   MCHC 32.5  30.0 - 36.0 g/dL   RDW 40.9  81.1 - 91.4 %   Platelets 264  150 - 400 K/uL  DIFFERENTIAL     Status: None   Collection Time    04/06/12 12:59 PM      Result Value Range   Neutrophils Relative 68  43 - 77 %   Neutro Abs 4.5  1.7 - 7.7 K/uL   Lymphocytes Relative 21  12 - 46 %   Lymphs Abs 1.4  0.7 - 4.0 K/uL   Monocytes Relative 9  3 - 12 %   Monocytes Absolute 0.6  0.1 - 1.0 K/uL   Eosinophils Relative 2  0 - 5 %   Eosinophils Absolute 0.1  0.0 - 0.7 K/uL   Basophils Relative 0  0 - 1 %   Basophils Absolute 0.0  0.0 - 0.1 K/uL  COMPREHENSIVE METABOLIC PANEL     Status: Abnormal   Collection Time    04/06/12 12:59 PM      Result Value Range   Sodium 139  135 - 145 mEq/L   Potassium 4.3  3.5 - 5.1 mEq/L   Chloride 103  96 - 112 mEq/L   CO2 24  19 - 32 mEq/L   Glucose, Bld 105 (*) 70 - 99 mg/dL   BUN 12  6 - 23 mg/dL   Creatinine, Ser 7.82  0.50 - 1.10 mg/dL   Calcium 8.6  8.4 - 95.6 mg/dL   Total Protein 6.2  6.0 - 8.3 g/dL   Albumin 3.0 (*) 3.5 - 5.2 g/dL   AST 27  0 - 37 U/L   ALT 10  0 - 35 U/L   Alkaline Phosphatase 56  39 - 117 U/L   Total Bilirubin 0.4  0.3 - 1.2 mg/dL   GFR calc non Af Amer 63 (*) >90 mL/min   GFR calc Af Amer 72 (*) >90 mL/min  TROPONIN I     Status: None   Collection Time    04/06/12 12:59 PM      Result Value Range   Troponin I <0.30  <0.30 ng/mL  POCT I-STAT, CHEM  8     Status: Abnormal   Collection Time    04/06/12  1:18 PM      Result Value Range   Sodium 141  135 - 145 mEq/L   Potassium 4.1  3.5 - 5.1 mEq/L   Chloride 106  96 - 112 mEq/L   BUN 10  6 - 23 mg/dL   Creatinine, Ser 1.30  0.50 - 1.10 mg/dL   Glucose, Bld 92  70 - 99 mg/dL   Calcium, Ion 8.65 (*) 1.13 - 1.30 mmol/L   TCO2 25  0 - 100 mmol/L   Hemoglobin 11.2 (*) 12.0 - 15.0 g/dL   HCT 78.4 (*) 69.6 - 29.5 %  CBC     Status: Abnormal   Collection Time    04/06/12  3:35 PM      Result Value Range   WBC 7.6  4.0 - 10.5 K/uL   RBC 3.76  (*) 3.87 - 5.11 MIL/uL   Hemoglobin 10.6 (*) 12.0 - 15.0 g/dL   HCT 28.4 (*) 13.2 - 44.0 %   MCV 87.8  78.0 - 100.0 fL   MCH 28.2  26.0 - 34.0 pg   MCHC 32.1  30.0 - 36.0 g/dL   RDW 10.2  72.5 - 36.6 %   Platelets 245  150 - 400 K/uL  HEMOGLOBIN A1C     Status: Abnormal   Collection Time    04/06/12  9:27 PM      Result Value Range   Hemoglobin A1C 5.7 (*) <5.7 %   Mean Plasma Glucose 117 (*) <117 mg/dL  GLUCOSE, CAPILLARY     Status: Abnormal   Collection Time    04/06/12 10:45 PM      Result Value Range   Glucose-Capillary 62 (*) 70 - 99 mg/dL   Comment 1 Documented in Chart     Comment 2 Notify RN    GLUCOSE, CAPILLARY     Status: None   Collection Time    04/07/12  6:55 AM      Result Value Range   Glucose-Capillary 75  70 - 99 mg/dL   Comment 1 Documented in Chart     Comment 2 Notify RN    LIPID PANEL     Status: None   Collection Time    04/07/12  7:35 AM      Result Value Range   Cholesterol 147  0 - 200 mg/dL   Triglycerides 83  <440 mg/dL   HDL 57  >34 mg/dL   Total CHOL/HDL Ratio 2.6     VLDL 17  0 - 40 mg/dL   LDL Cholesterol 73  0 - 99 mg/dL    Ct Head Wo Contrast  04/06/2012  *RADIOLOGY REPORT*  Clinical Data: Slurred speech.  Right-sided weakness and numbness. Code stroke  CT HEAD WITHOUT CONTRAST  Technique:  Contiguous axial images were obtained from the base of the skull through the vertex without contrast.  Comparison: CT head 03/23/2012  Findings: Image quality degraded by moderate motion.  Multiple images were repeated.  Generalized atrophy.  Chronic microvascular ischemia in the white matter.  No acute infarct is identified.  Negative for hemorrhage or mass lesion.  Calvarium intact.  IMPRESSION: Atrophy and chronic microvascular ischemia.  No acute abnormality.  Considerable motion on the study degrades image quality.  Critical Value/emergent results were called by telephone at the time of interpretation on 04/06/2012 at 1350 hours  to Dr. Lu Duffel, who  verbally acknowledged these results.  Original Report Authenticated By: Janeece Riggers, M.D.    Mri Brain Without Contrast  04/07/2012  *RADIOLOGY REPORT*  Clinical Data:  Slurred speech with right-sided weakness.  MRI HEAD WITHOUT CONTRAST MRA HEAD WITHOUT CONTRAST  Technique:  Multiplanar, multiecho pulse sequences of the brain and surrounding structures were obtained without intravenous contrast. Angiographic images of the head were obtained using MRA technique without contrast.  Comparison:  CT head 04/06/2012  MRI HEAD  Findings:  Small area of restricted diffusion in the left posterior mid brain, compatible with acute infarct.  This measures approximately 3 mm in size.  No other acute infarct.  There is generalized atrophy.  Chronic ischemic changes are present throughout the cerebral white matter bilaterally.  Mild chronic ischemia in the pons and thalami.  Negative for intracranial hemorrhage or mass.  IMPRESSION: Small area of acute infarct left posterior mid brain.  Atrophy and chronic microvascular ischemia.  MRA HEAD  Findings: Both vertebral arteries are patent to the basilar. Moderate to severe stenosis in the mid basilar.  Fetal origin of the right posterior cerebral artery.  Moderate stenosis in the left posterior cerebral artery and mild stenosis in the right posterior cerebral artery.  Internal carotid artery is patent bilaterally with mild atherosclerotic irregularity in the cavernous segment bilaterally. Right A1 segment is hypoplastic.  Both anterior cerebral arteries are supplied from the left and are patent.  Mild atherosclerotic disease in the middle cerebral artery branches bilaterally. Moderate stenosis of the distal right M1 segment.  Mild to moderate stenosis distal left A1 segment.  IMPRESSION: Moderately severe intracranial atherosclerotic disease.  There is a basilar stenosis in the midportion which could account for the mid brain infarct.   Original Report Authenticated By: Janeece Riggers, M.D.    Mr Mra Head/brain Wo Cm  04/07/2012  *RADIOLOGY REPORT*  Clinical Data:  Slurred speech with right-sided weakness.  MRI HEAD WITHOUT CONTRAST MRA HEAD WITHOUT CONTRAST  Technique:  Multiplanar, multiecho pulse sequences of the brain and surrounding structures were obtained without intravenous contrast. Angiographic images of the head were obtained using MRA technique without contrast.  Comparison:  CT head 04/06/2012  MRI HEAD  Findings:  Small area of restricted diffusion in the left posterior mid brain, compatible with acute infarct.  This measures approximately 3 mm in size.  No other acute infarct.  There is generalized atrophy.  Chronic ischemic changes are present throughout the cerebral white matter bilaterally.  Mild chronic ischemia in the pons and thalami.  Negative for intracranial hemorrhage or mass.  IMPRESSION: Small area of acute infarct left posterior mid brain.  Atrophy and chronic microvascular ischemia.  MRA HEAD  Findings: Both vertebral arteries are patent to the basilar. Moderate to severe stenosis in the mid basilar.  Fetal origin of the right posterior cerebral artery.  Moderate stenosis in the left posterior cerebral artery and mild stenosis in the right posterior cerebral artery.  Internal carotid artery is patent bilaterally with mild atherosclerotic irregularity in the cavernous segment bilaterally. Right A1 segment is hypoplastic.  Both anterior cerebral arteries are supplied from the left and are patent.  Mild atherosclerotic disease in the middle cerebral artery branches bilaterally. Moderate stenosis of the distal right M1 segment.  Mild to moderate stenosis distal left A1 segment.  IMPRESSION: Moderately severe intracranial atherosclerotic disease.  There is a basilar stenosis in the midportion which could account for the mid brain infarct.   Original Report Authenticated By: Janeece Riggers, M.D.     CT  of the brain    IMPRESSION:  Atrophy and chronic  microvascular ischemia. No acute abnormality.  CT angio  Not ordered  MRI of the brain   IMPRESSION:  Moderately severe intracranial atherosclerotic disease. There is a  basilar stenosis in the midportion which could account for the mid  brain infarct.   MRA of the brain  IMPRESSION:  Moderately severe intracranial atherosclerotic disease. There is a  basilar stenosis in the midportion which could account for the mid  brain infarct.   2D Echocardiogram  pending  Carotid Doppler  pending  CXR    EKG  NSR, rate 81, right anterior fascicular block  Physical Exam    The patient is alert and cooperative.  Respiratory examination is clear.  Cardiovascular examination reveals a regular rate and rhythm, no obvious murmurs or rubs are noted.  Neurologic exam reveals full extraocular movements, speech is normal. Visual fields are full.  Motor testing reveals good strength of all four extremities.  The patient has good finger-nose-finger and heel-to-shin bilaterally. Gait was not tested.  Deep tendon reflexes are symmetric and normal. Toes are down going bilaterally.     ASSESSMENT Ms. Angela Mcintosh is a 79 y.o. female with a paramedian pontine stroke, secondary to atherosclerotic vessel changes. On clopidogrel 75 mg orally every day for secondary stroke prevention.  Stroke risk factors:  hyperlipidemia, coronary artery disease.   Hospital day # 1  One. Small left paracentral pontine stroke  2. Intracranial atherosclerosis, mid basilar stenosis   TREATMENT/PLAN The patient likely has sustained a small vessel infarct associated with atherosclerotic changes of the intracranial circulation. The patient does have mid basilar disease that may be the source of the small stroke event. The patient was on Plavix prior to the onset of the stroke.  -2D echocardiogram -Carotid Doppler study -Plavix therapy -Mobilize patient    Lesly Dukes

## 2012-04-07 NOTE — Progress Notes (Signed)
  Echocardiogram 2D Echocardiogram has been performed.  Yavuz Kirby FRANCES 04/07/2012, 2:57 PM

## 2012-04-07 NOTE — Progress Notes (Signed)
[facility]             Progress Note     Assessment/Plan:  1. CVA of L posterior mid brain, noted on MRI, MRA with moderately severe intracranial atheroscerotic disease Neuro following, continue plavix and statin Carotid duplex and 2D echo pending LDL 73 and Hbaic 5.7 PT/OT/speech eval  2. Dementia: stable  3. CAD: stable, continue plavix/statin  DVT proph: lovenox  DNR Family communication: d/w daughter at bedside Disposition: home pending stroke workup  Time spent coordinating care:   LOS: 1 day   [facility] Pager: [phone]  04/07/2012, 10:59 PM    Subjective: Feels well, R leg weakness better  Objective: Vital signs in last 24 hours: Temp:  [97.9 F (36.6 C)-98.8 F (37.1 C)] 98.8 F (37.1 C) (02/08 2121) Pulse Rate:  [76-92] 86 (02/08 2121) Resp:  [16-20] 18 (02/08 2121) BP: (103-111)/(60-73) 103/62 mmHg (02/08 2121) SpO2:  [96 %-100 %] 97 % (02/08 2121) Weight:  [45.36 kg (100 lb)] 45.36 kg (100 lb) (02/08 1100) Weight change:  Last BM Date: 04/06/12  Intake/Output from previous day:       Physical Exam: General: Alert, awake, oriented x2, in no acute distress. HEENT: No bruits, no goiter. Heart: Regular rate and rhythm, without murmurs, rubs, gallops. Lungs: Clear to auscultation bilaterally. Abdomen: Soft, nontender, nondistended, positive bowel sounds. Extremities: No clubbing cyanosis or edema with positive pedal pulses. Neuro: Cranial nerves 2-12 grossly intact,  RLE 4+/5, rest 5/5, sensations light touch intact Plantars mute, DTR 1 plus b/l    Lab Results: Basic Metabolic Panel:  Recent Labs  45/40/98 1259 04/06/12 1318  NA 139 141  K 4.3 4.1  CL 103 106  CO2 24  --   GLUCOSE 105* 92  BUN 12 10  CREATININE 0.80 0.90  CALCIUM 8.6  --    Liver Function Tests:  Recent Labs  04/06/12 1259  AST 27  ALT 10  ALKPHOS 56  BILITOT 0.4  PROT 6.2  ALBUMIN 3.0*   No results found  for this basename: LIPASE, AMYLASE,  in the last 72 hours No results found for this basename: AMMONIA,  in the last 72 hours CBC:  Recent Labs  04/06/12 1259 04/06/12 1318 04/06/12 1535  WBC 6.6  --  7.6  NEUTROABS 4.5  --   --   HGB 10.9* 11.2* 10.6*  HCT 33.5* 33.0* 33.0*  MCV 87.0  --  87.8  PLT 264  --  245   Cardiac Enzymes:  Recent Labs  04/06/12 1259  TROPONINI <0.30   BNP: No results found for this basename: PROBNP,  in the last 72 hours D-Dimer: No results found for this basename: DDIMER,  in the last 72 hours CBG:  Recent Labs  04/06/12 2245 04/07/12 0655 04/07/12 1127 04/07/12 1627  GLUCAP 62* 75 146* 90   Hemoglobin A1C:  Recent Labs  04/06/12 2127  HGBA1C 5.7*   Fasting Lipid Panel:  Recent Labs  04/07/12 0735  CHOL 147  HDL 57  LDLCALC 73  TRIG 83  CHOLHDL 2.6   Thyroid Function Tests: No results found for this basename: TSH, T4TOTAL, FREET4, T3FREE, THYROIDAB,  in the last 72 hours Anemia Panel: No results found for this basename: VITAMINB12, FOLATE, FERRITIN, TIBC, IRON, RETICCTPCT,  in the last 72 hours Coagulation:  Recent Labs  04/06/12 1259  LABPROT 13.8  INR 1.07   Urine Drug Screen: Drugs of Abuse  No results found for this basename: labopia,  cocainscrnur, labbenz, amphetmu, thcu, labbarb    Alcohol Level: No results found for this basename: ETH,  in the last 72 hours Urinalysis: No results found for this basename: COLORURINE, APPERANCEUR, LABSPEC, PHURINE, GLUCOSEU, HGBUR, BILIRUBINUR, KETONESUR, PROTEINUR, UROBILINOGEN, NITRITE, LEUKOCYTESUR,  in the last 72 hours  No results found for this or any previous visit (from the past 240 hour(s)).  Studies/Results: Ct Head Wo Contrast  04/06/2012  *RADIOLOGY REPORT*  Clinical Data: Slurred speech.  Right-sided weakness and numbness. Code stroke  CT HEAD WITHOUT CONTRAST  Technique:  Contiguous axial images were obtained from the base of the skull through the vertex  without contrast.  Comparison: CT head 03/23/2012  Findings: Image quality degraded by moderate motion.  Multiple images were repeated.  Generalized atrophy.  Chronic microvascular ischemia in the white matter.  No acute infarct is identified.  Negative for hemorrhage or mass lesion.  Calvarium intact.  IMPRESSION: Atrophy and chronic microvascular ischemia.  No acute abnormality.  Considerable motion on the study degrades image quality.  Critical Value/emergent results were called by telephone at the time of interpretation on 04/06/2012 at 1350 hours  to Dr. Lu Duffel, who verbally acknowledged these results.   Original Report Authenticated By: Janeece Riggers, M.D.    Mri Brain Without Contrast  04/07/2012  *RADIOLOGY REPORT*  Clinical Data:  Slurred speech with right-sided weakness.  MRI HEAD WITHOUT CONTRAST MRA HEAD WITHOUT CONTRAST  Technique:  Multiplanar, multiecho pulse sequences of the brain and surrounding structures were obtained without intravenous contrast. Angiographic images of the head were obtained using MRA technique without contrast.  Comparison:  CT head 04/06/2012  MRI HEAD  Findings:  Small area of restricted diffusion in the left posterior mid brain, compatible with acute infarct.  This measures approximately 3 mm in size.  No other acute infarct.  There is generalized atrophy.  Chronic ischemic changes are present throughout the cerebral white matter bilaterally.  Mild chronic ischemia in the pons and thalami.  Negative for intracranial hemorrhage or mass.  IMPRESSION: Small area of acute infarct left posterior mid brain.  Atrophy and chronic microvascular ischemia.  MRA HEAD  Findings: Both vertebral arteries are patent to the basilar. Moderate to severe stenosis in the mid basilar.  Fetal origin of the right posterior cerebral artery.  Moderate stenosis in the left posterior cerebral artery and mild stenosis in the right posterior cerebral artery.  Internal carotid artery is patent bilaterally  with mild atherosclerotic irregularity in the cavernous segment bilaterally. Right A1 segment is hypoplastic.  Both anterior cerebral arteries are supplied from the left and are patent.  Mild atherosclerotic disease in the middle cerebral artery branches bilaterally. Moderate stenosis of the distal right M1 segment.  Mild to moderate stenosis distal left A1 segment.  IMPRESSION: Moderately severe intracranial atherosclerotic disease.  There is a basilar stenosis in the midportion which could account for the mid brain infarct.   Original Report Authenticated By: Janeece Riggers, M.D.    Mr Mra Head/brain Wo Cm  04/07/2012  *RADIOLOGY REPORT*  Clinical Data:  Slurred speech with right-sided weakness.  MRI HEAD WITHOUT CONTRAST MRA HEAD WITHOUT CONTRAST  Technique:  Multiplanar, multiecho pulse sequences of the brain and surrounding structures were obtained without intravenous contrast. Angiographic images of the head were obtained using MRA technique without contrast.  Comparison:  CT head 04/06/2012  MRI HEAD  Findings:  Small area of restricted diffusion in the left posterior mid brain, compatible with acute infarct.  This measures approximately 3 mm  in size.  No other acute infarct.  There is generalized atrophy.  Chronic ischemic changes are present throughout the cerebral white matter bilaterally.  Mild chronic ischemia in the pons and thalami.  Negative for intracranial hemorrhage or mass.  IMPRESSION: Small area of acute infarct left posterior mid brain.  Atrophy and chronic microvascular ischemia.  MRA HEAD  Findings: Both vertebral arteries are patent to the basilar. Moderate to severe stenosis in the mid basilar.  Fetal origin of the right posterior cerebral artery.  Moderate stenosis in the left posterior cerebral artery and mild stenosis in the right posterior cerebral artery.  Internal carotid artery is patent bilaterally with mild atherosclerotic irregularity in the cavernous segment bilaterally. Right A1  segment is hypoplastic.  Both anterior cerebral arteries are supplied from the left and are patent.  Mild atherosclerotic disease in the middle cerebral artery branches bilaterally. Moderate stenosis of the distal right M1 segment.  Mild to moderate stenosis distal left A1 segment.  IMPRESSION: Moderately severe intracranial atherosclerotic disease.  There is a basilar stenosis in the midportion which could account for the mid brain infarct.   Original Report Authenticated By: Janeece Riggers, M.D.     Medications: Scheduled Meds: . atorvastatin  10 mg Oral q1800  . clopidogrel  75 mg Oral Daily  . donepezil  10 mg Oral QHS  . famotidine  20 mg Oral BID  . mirtazapine  7.5 mg Oral QHS  . nitroGLYCERIN  0.4 mg Transdermal Daily  . raloxifene  60 mg Oral QHS  . sodium chloride  3 mL Intravenous Q12H   Continuous Infusions:  PRN Meds:.sodium chloride, acetaminophen

## 2012-04-07 NOTE — Evaluation (Signed)
Occupational Therapy Evaluation Patient Details Name: Angela Mcintosh MRN: [id number] DOB: 1919-01-18 Today's Date: 04/07/2012 Time: 6962-9528 OT Time Calculation (min): 24 min  OT Assessment / Plan / Recommendation Clinical Impression  Pt admitted with expressive aphasia and RUE weakness.  MRI shows small infarct in left posterior mid brain. Will benefit from acute OT services to address below problem list.  Pt has 24/7 assist at home from daughter.  Recommending HHOT.    OT Assessment  Patient needs continued OT Services    Follow Up Recommendations  Home health OT;Supervision/Assistance - 24 hour    Barriers to Discharge None    Equipment Recommendations   (tbd)    Recommendations for Other Services    Frequency  Min 3X/week    Precautions / Restrictions Precautions Precautions: Fall   Pertinent Vitals/Pain See vitals    ADL  Upper Body Bathing: Simulated;Set up Where Assessed - Upper Body Bathing: Unsupported sitting Lower Body Bathing: Simulated;Supervision/safety Where Assessed - Lower Body Bathing: Unsupported sitting Upper Body Dressing: Performed;Set up Where Assessed - Upper Body Dressing: Unsupported sitting Lower Body Dressing: Performed;Supervision/safety Where Assessed - Lower Body Dressing: Unsupported sitting Toilet Transfer: Simulated;Minimal assistance Toilet Transfer Method: Sit to stand Toilet Transfer Equipment:  (bed to chair) Equipment Used: Rolling walker;Gait belt Transfers/Ambulation Related to ADLs: min assist with RW ADL Comments: Pt easily fatigues.  Unsafe use of RW during functionaly mobility- likes to keep hands on RW during sit<>stand.    OT Diagnosis: Generalized weakness;Paresis  OT Problem List: Decreased strength;Decreased activity tolerance;Impaired balance (sitting and/or standing);Decreased safety awareness;Decreased knowledge of use of DME or AE OT Treatment Interventions: Self-care/ADL training;DME and/or AE  instruction;Therapeutic activities;Patient/family education;Balance training   OT Goals Acute Rehab OT Goals OT Goal Formulation: With patient/family Time For Goal Achievement: 04/14/12 Potential to Achieve Goals: Good ADL Goals Pt Will Perform Grooming: with modified independence;Standing at sink ADL Goal: Grooming - Progress: Goal set today Pt Will Perform Lower Body Bathing: with modified independence;Sit to stand from chair;Sit to stand from bed ADL Goal: Lower Body Bathing - Progress: Goal set today Pt Will Perform Lower Body Dressing: with modified independence;Sit to stand from chair;Sit to stand from bed ADL Goal: Lower Body Dressing - Progress: Goal set today Pt Will Transfer to Toilet: with modified independence;Ambulation;with DME;Regular height toilet ADL Goal: Toilet Transfer - Progress: Goal set today Pt Will Perform Toileting - Clothing Manipulation: with modified independence;Sitting on 3-in-1 or toilet;Standing ADL Goal: Toileting - Clothing Manipulation - Progress: Goal set today Pt Will Perform Toileting - Hygiene: with modified independence;Sit to stand from 3-in-1/toilet ADL Goal: Toileting - Hygiene - Progress: Goal set today Pt Will Perform Tub/Shower Transfer: Tub transfer;with supervision;Ambulation;with DME;Shower seat with back ADL Goal: Web designer - Progress: Goal set today Miscellaneous OT Goals Miscellaneous OT Goal #1: Pt will consistently demonstrate safe use of RW during all functional mobility. OT Goal: Miscellaneous Goal #1 - Progress: Goal set today  Visit Information  Last OT Received On: 04/07/12 Assistance Needed: +1 PT/OT Co-Evaluation/Treatment: Yes    Subjective Data      Prior Functioning     Home Living Lives With: Daughter Available Help at Discharge: Family Type of Home: House Home Access: Ramped entrance Home Layout: One level Bathroom Shower/Tub: Engineer, manufacturing systems: Standard Home Adaptive Equipment:  Walker - rolling;Wheelchair - manual;Straight cane;Bedside commode/3-in-1 Additional Comments: daughter reports she has a small stool in the tub (unsure if this is a shower chair) which pt sits on and then lowers  from stool down into tub for bath. Prior Function Level of Independence: Independent with assistive device(s) Able to Take Stairs?: Yes Driving: No Vocation: Retired Musician: No difficulties         Vision/Perception Vision - History Baseline Vision: Wears glasses only for reading   Cognition  Cognition Overall Cognitive Status: Appears within functional limits for tasks assessed/performed Arousal/Alertness: Awake/alert Orientation Level: Appears intact for tasks assessed Behavior During Session: [facility] for tasks performed Cognition - Other Comments: history of dementia    Extremity/Trunk Assessment Right Upper Extremity Assessment RUE ROM/Strength/Tone: [facility] for tasks assessed;Deficits RUE ROM/Strength/Tone Deficits: 3+/5 - 4/5 throughout RUE Sensation: WFL - Light Touch;WFL - Proprioception RUE Coordination: WFL - gross/fine motor Left Upper Extremity Assessment LUE ROM/Strength/Tone: WFL for tasks assessed (4/5 throughout) LUE Sensation: WFL - Proprioception;WFL - Light Touch Right Lower Extremity Assessment RLE ROM/Strength/Tone: WFL for tasks assessed RLE Sensation: Deficits RLE Sensation Deficits: decreased light touch below knee Left Lower Extremity Assessment LLE ROM/Strength/Tone: WFL for tasks assessed LLE Sensation: WFL - Light Touch     Mobility Bed Mobility Bed Mobility: Supine to Sit Supine to Sit: 4: Min assist Transfers Transfers: Sit to Stand;Stand to Sit Sit to Stand: 4: Min assist;From bed;From chair/3-in-1;With upper extremity assist Stand to Sit: 4: Min guard;To chair/3-in-1 Details for Transfer Assistance: verbal cues for hand placement     Exercise     Balance     End of Session OT - End of Session Equipment  Utilized During Treatment: Gait belt Activity Tolerance: Patient limited by fatigue Patient left: in chair;with call bell/phone within reach;with family/visitor present Nurse Communication: Mobility status  GO    04/07/2012 Cipriano Mile OTR/L Pager [phone] Office [phone]  Cipriano Mile 04/07/2012, 4:33 PM

## 2012-04-07 NOTE — Progress Notes (Signed)
VASCULAR LAB PRELIMINARY  PRELIMINARY  PRELIMINARY  PRELIMINARY  Carotid Dopplers completed.    Preliminary report:  There is no ICA stenosis.  Vertebral artery flow is antegrade.  Rosalia Mcavoy, RVT 04/07/2012, 10:56 AM

## 2012-04-08 LAB — GLUCOSE, CAPILLARY: Glucose-Capillary: 101 mg/dL — ABNORMAL HIGH (ref 70–99)

## 2012-04-08 MED ORDER — WARFARIN SODIUM 3 MG PO TABS: 3.0000 mg | ORAL_TABLET | Freq: Every day | ORAL | Status: DC

## 2012-04-08 NOTE — Progress Notes (Signed)
NCM spoke to pt and offered choice. Pt requested [facility] for HH. Faxed notification to [facility] of pts scheduled dc home today with HH. [facility] contact info added to dc instructions. Isidoro Donning RN CCM Case Mgmt phone [phone]

## 2012-04-08 NOTE — Discharge Summary (Signed)
[facility]                                                                                   Angela Mcintosh, is a 79 y.o. female  DOB 1918/03/27  MRN [id number].  Admission date:  04/06/2012  Discharge Date:  04/08/2012  Primary MD  Robynn Pane, MD  Admitting Physician  Zannie Cove, MD  Admission Diagnosis  Coronary artery disease [414.00] TIA (transient ischemic attack) [435.9] Transient ischemic attack [435.9] Dementia [294.20]  Discharge Diagnosis     Principal Problem:   Stroke, small vessel Active Problems:   High cholesterol   Dementia   Right sided weakness   Coronary artery disease   TIA (transient ischemic attack)   Past Medical History  Diagnosis Date  . Coronary artery disease   . High cholesterol   . Dementia   . TIA (transient ischemic attack)   . Stroke, small vessel     Past Surgical History  Procedure Laterality Date  . Coronary angioplasty with stent placement       Recommendations for primary care physician for things to follow:      Discharge Diagnoses:   Principal Problem:   Stroke, small vessel Active Problems:   High cholesterol   Dementia   Right sided weakness   Coronary artery disease   TIA (transient ischemic attack)    Discharge Condition: Stable   Diet recommendation: See Discharge Instructions below   Consults neurology, PT, OT, case management, speech   History of present illness and  Hospital Course:  See H&P, Labs, Consult and Test reports for all details in brief, 79 year old [place] American female with underlying dementia and CAD who was already on Plavix and statin was admitted with right-sided weakness and some facial droop, her MRI and MRA of the brain along with physical exam were consistent with left posterior mid brain ischemic infarct, she underwent bilateral carotid duplex which was unremarkable, she was seen by PT OT and speech, plan is to discharge home with home PT OT, she  was seen by neurologist and was suggested to continue her Plavix and statin, her A1c was 5.7 and LDL was 73.  Her neurological deficits are improving and patient appears stable, she has underlying dementia and mild confusion from time to time, 2-D echogram was noted and discussed with Dr. Sharyn Lull patient's PCP and cardiologist, questionable thrombus and PFO, discussed patient's daughter and Dr. Sharyn Lull in detail with all risks and benefits, off Coumadin in the setting of advanced dementia and fall risk, at this time patient's daughter wants her to be placed on Coumadin, Dr. Sharyn Lull will monitor her Coumadin, her Plavix will be stopped, if the daughter wants further testing outpatient TEE and catheter-based PFO closure can be considered.      Today   Subjective:   Angela Mcintosh today has no headache,no chest abdominal pain,no new weakness tingling or numbness, feels much better .  Objective:   Blood pressure 97/60, pulse 86, temperature 98.4 F (36.9 C), temperature source Oral, resp. rate 18, height 4\' 10"  (1.473 m), weight 45.36 kg (100 lb), SpO2 97.00%.   Intake/Output Summary (Last 24 hours) at 04/08/12 1144  Last data filed at 04/07/12 1223  Gross per 24 hour  Intake    120 ml  Output      0 ml  Net    120 ml    Exam Awake Alert, but mildly confused, No new F.N deficits, Normal affect, mild facial droop and right-sided weakness. [place].AT,PERRAL Supple Neck,No JVD, No cervical lymphadenopathy appriciated.  Symmetrical Chest wall movement, Good air movement bilaterally, CTAB RRR,No Gallops,Rubs or new Murmurs, No Parasternal Heave +ve B.Sounds, Abd Soft, Non tender, No organomegaly appriciated, No rebound -guarding or rigidity. No Cyanosis, Clubbing or edema, No new Rash or bruise  Data Review   Major procedures and Radiology Reports - PLEASE review detailed and final reports for all details in brief -   Echocardiogram -  - Left ventricle: The cavity size was normal.  Systolic function was normal. The estimated ejection fraction was in the range of 55% to 60%. Wall motion was normal; there were no regional wall motion abnormalities. Doppler parameters are consistent with abnormal left ventricular relaxation (grade 1 diastolic dysfunction). - Right atrium: Cannot exclude a small, pedunculated, mobile thrombus. - Atrial septum: A patent foramen ovale cannot be excluded. - Pulmonary arteries: Systolic pressure was mildly increased. PA peak pressure: 31mm Hg (S). - Pericardium, extracardiac: Features were not consistent with tamponade physiology.      Bilateral carotid duplex essentially unremarkable   Ct Head Wo Contrast  04/06/2012  *RADIOLOGY REPORT*  Clinical Data: Slurred speech.  Right-sided weakness and numbness. Code stroke  CT HEAD WITHOUT CONTRAST  Technique:  Contiguous axial images were obtained from the base of the skull through the vertex without contrast.  Comparison: CT head 03/23/2012  Findings: Image quality degraded by moderate motion.  Multiple images were repeated.  Generalized atrophy.  Chronic microvascular ischemia in the white matter.  No acute infarct is identified.  Negative for hemorrhage or mass lesion.  Calvarium intact.  IMPRESSION: Atrophy and chronic microvascular ischemia.  No acute abnormality.  Considerable motion on the study degrades image quality.  Critical Value/emergent results were called by telephone at the time of interpretation on 04/06/2012 at 1350 hours  to Dr. Lu Duffel, who verbally acknowledged these results.   Original Report Authenticated By: Janeece Riggers, M.D.    Ct Head Wo Contrast  03/23/2012  *RADIOLOGY REPORT*  Clinical Data: Weakness  CT HEAD WITHOUT CONTRAST  Technique:  Contiguous axial images were obtained from the base of the skull through the vertex without contrast.  Comparison: 08/24/2010  Findings: No skull fracture is noted.  Atherosclerotic calcifications of carotid siphon.  Paranasal sinuses and  mastoid air cells are unremarkable.  Stable cerebral atrophy.  Again noted periventricular and subcortical chronic white matter disease.  No acute cortical infarction.  No mass lesion is noted on this unenhanced scan.  Ventricular size is stable from prior exam.  No intracranial hemorrhage, mass effect or midline shift.  IMPRESSION: No acute intracranial abnormality.  Stable atrophy and chronic white matter disease.  No definite acute cortical infarction.   Original Report Authenticated By: Natasha Mead, M.D.    Mri Brain Without Contrast  04/07/2012  *RADIOLOGY REPORT*  Clinical Data:  Slurred speech with right-sided weakness.  MRI HEAD WITHOUT CONTRAST MRA HEAD WITHOUT CONTRAST  Technique:  Multiplanar, multiecho pulse sequences of the brain and surrounding structures were obtained without intravenous contrast. Angiographic images of the head were obtained using MRA technique without contrast.  Comparison:  CT head 04/06/2012  MRI HEAD  Findings:  Small area of  restricted diffusion in the left posterior mid brain, compatible with acute infarct.  This measures approximately 3 mm in size.  No other acute infarct.  There is generalized atrophy.  Chronic ischemic changes are present throughout the cerebral white matter bilaterally.  Mild chronic ischemia in the pons and thalami.  Negative for intracranial hemorrhage or mass.  IMPRESSION: Small area of acute infarct left posterior mid brain.  Atrophy and chronic microvascular ischemia.  MRA HEAD  Findings: Both vertebral arteries are patent to the basilar. Moderate to severe stenosis in the mid basilar.  Fetal origin of the right posterior cerebral artery.  Moderate stenosis in the left posterior cerebral artery and mild stenosis in the right posterior cerebral artery.  Internal carotid artery is patent bilaterally with mild atherosclerotic irregularity in the cavernous segment bilaterally. Right A1 segment is hypoplastic.  Both anterior cerebral arteries are supplied  from the left and are patent.  Mild atherosclerotic disease in the middle cerebral artery branches bilaterally. Moderate stenosis of the distal right M1 segment.  Mild to moderate stenosis distal left A1 segment.  IMPRESSION: Moderately severe intracranial atherosclerotic disease.  There is a basilar stenosis in the midportion which could account for the mid brain infarct.   Original Report Authenticated By: Janeece Riggers, M.D.    Mr Mra Head/brain Wo Cm  04/07/2012  *RADIOLOGY REPORT*  Clinical Data:  Slurred speech with right-sided weakness.  MRI HEAD WITHOUT CONTRAST MRA HEAD WITHOUT CONTRAST  Technique:  Multiplanar, multiecho pulse sequences of the brain and surrounding structures were obtained without intravenous contrast. Angiographic images of the head were obtained using MRA technique without contrast.  Comparison:  CT head 04/06/2012  MRI HEAD  Findings:  Small area of restricted diffusion in the left posterior mid brain, compatible with acute infarct.  This measures approximately 3 mm in size.  No other acute infarct.  There is generalized atrophy.  Chronic ischemic changes are present throughout the cerebral white matter bilaterally.  Mild chronic ischemia in the pons and thalami.  Negative for intracranial hemorrhage or mass.  IMPRESSION: Small area of acute infarct left posterior mid brain.  Atrophy and chronic microvascular ischemia.  MRA HEAD  Findings: Both vertebral arteries are patent to the basilar. Moderate to severe stenosis in the mid basilar.  Fetal origin of the right posterior cerebral artery.  Moderate stenosis in the left posterior cerebral artery and mild stenosis in the right posterior cerebral artery.  Internal carotid artery is patent bilaterally with mild atherosclerotic irregularity in the cavernous segment bilaterally. Right A1 segment is hypoplastic.  Both anterior cerebral arteries are supplied from the left and are patent.  Mild atherosclerotic disease in the middle cerebral  artery branches bilaterally. Moderate stenosis of the distal right M1 segment.  Mild to moderate stenosis distal left A1 segment.  IMPRESSION: Moderately severe intracranial atherosclerotic disease.  There is a basilar stenosis in the midportion which could account for the mid brain infarct.   Original Report Authenticated By: Janeece Riggers, M.D.     Micro Results   Lab Results  Component Value Date   INR 1.07 04/06/2012   INR 1.06 03/23/2012   INR 1.06 04/06/2009     No results found for this or any previous visit (from the past 240 hour(s)).   CBC w Diff: Lab Results  Component Value Date   WBC 7.6 04/06/2012   HGB 10.6* 04/06/2012   HCT 33.0* 04/06/2012   PLT 245 04/06/2012   LYMPHOPCT 21 04/06/2012   MONOPCT  9 04/06/2012   EOSPCT 2 04/06/2012   BASOPCT 0 04/06/2012    CMP: Lab Results  Component Value Date   NA 141 04/06/2012   K 4.1 04/06/2012   CL 106 04/06/2012   CO2 24 04/06/2012   BUN 10 04/06/2012   CREATININE 0.90 04/06/2012   PROT 6.2 04/06/2012   ALBUMIN 3.0* 04/06/2012   BILITOT 0.4 04/06/2012   ALKPHOS 56 04/06/2012   AST 27 04/06/2012   ALT 10 04/06/2012  .   Discharge Instructions     Follow with Primary MD Robynn Pane, MD in 3 days   Get CBC, CMP, INR checked 3 days by Primary MD and again as instructed by your Primary MD. Get a 2 view Chest X ray done next visit if you had Pneumonia of Lung problems at the Hospital.  Get Medicines reviewed and adjusted.  Please request your Prim.MD to go over all Hospital Tests and Procedure/Radiological results at the follow up, please get all Hospital records sent to your Prim MD by signing hospital release before you go home.  Activity: As tolerated with Full fall precautions use walker/cane & assistance as needed   Diet:  Heart Healthy, Aspiration precautions.  For Heart failure patients - Check your Weight same time everyday, if you gain over 2 pounds, or you develop in leg swelling, experience more shortness of breath or chest pain, call  your Primary MD immediately. Follow Cardiac Low Salt Diet and 1.8 lit/day fluid restriction.  Disposition Home   If you experience worsening of your admission symptoms, develop shortness of breath, life threatening emergency, suicidal or homicidal thoughts you must seek medical attention immediately by calling 911 or calling your MD immediately  if symptoms less severe.  You Must read complete instructions/literature along with all the possible adverse reactions/side effects for all the Medicines you take and that have been prescribed to you. Take any new Medicines after you have completely understood and accpet all the possible adverse reactions/side effects.       Follow-up Information   Follow up with Robynn Pane, MD. Schedule an appointment as soon as possible for a visit in 3 days.   Contact information:   [address] [place] [address] [phone]       Follow up with Gates Rigg, MD In 2 weeks.   Contact information:   [address], [address] [facility] [place] [address] [phone]         Discharge Medications     Medication List    STOP taking these medications       clopidogrel 75 MG tablet  Commonly known as:  PLAVIX      TAKE these medications       Calcium-Vitamin D 600-200 MG-UNIT Caps  Take by mouth.     donepezil 10 MG tablet  Commonly known as:  ARICEPT  Take 10 mg by mouth at bedtime.     famotidine 20 MG tablet  Commonly known as:  PEPCID  Take 20 mg by mouth 2 (two) times daily.     Iron Tabs  Take 1 tablet by mouth daily.     mirtazapine 7.5 MG tablet  Commonly known as:  REMERON  Take 7.5 mg by mouth at bedtime.     multivitamin with minerals Tabs  Take 1 tablet by mouth daily.     nitroGLYCERIN 0.4 mg/hr  Commonly known as:  NITRODUR - Dosed in mg/24 hr  Place 1 patch onto  the skin daily.     raloxifene 60 MG tablet  Commonly known as:  EVISTA  Take 60 mg by mouth at  bedtime.     rosuvastatin 10 MG tablet  Commonly known as:  CRESTOR  Take 10 mg by mouth daily.     Vitamin D 2000 UNITS tablet  Take 2,000 Units by mouth daily.     warfarin 3 MG tablet  Commonly known as:  COUMADIN  Take 1 tablet (3 mg total) by mouth daily.           Total Time in preparing paper work, data evaluation and todays exam - 35 minutes  Leroy Sea M.D on 04/08/2012 at 11:44 AM  [facility] Office  [phone]

## 2012-04-08 NOTE — Progress Notes (Signed)
Stroke Team Progress Note  HISTORY  Angela Mcintosh is an 79 y.o. female who was recently seen in ED 03/30/2012 for similar symptoms of right sided weakness and numbness. Today patient was shopping with her daughter when she had a sudden onset of expressive aphasia and right UE weakness. Patient was brought to ED and Code stoke was initiated. By the time she had finished CT head her symptoms had resolved. Daughter states 2 weeks ago she had to decrease patients Plavix to half a pill due to stomach upset but just over past two days she as been taking a full Plavix. At time of Examination patient scored a 2 for wrong month and getting her age wrong, other wise patient was back to baseline.  No TPA due to early resolution of symptoms.    SUBJECTIVE The patient is somewhat lethargic this morning. She just woke up. She states that she is confused. She is able to name the city of [place] and she is able to identify her daughter who is in the room. She thought she was at home and was unable to tell me the date or year. I discussed the plans for home health physical and occupational therapies with the patient's daughter. She is the primary caregiver and she is in agreement with this plan. She is concerned however that her mother does not eat enough in the hospital or at home.   OBJECTIVE Most recent Vital Signs: Temp: 98.4 F (36.9 C) (02/09 0537) Temp src: Oral (02/09 0537) BP: 97/60 mmHg (02/09 0537) Pulse Rate: 86 (02/09 0537) Respiratory Rate: 18 O2 Saturation: 97%  CBG (last 3)   Recent Labs  04/07/12 1627 04/07/12 2123 04/08/12 0713  GLUCAP 90 126* 101*   Intake/Output from previous day: 02/08 0701 - 02/09 0700 In: 480 [P.O.:480] Out: -   IV Fluid Intake:    Medications  . atorvastatin  10 mg Oral q1800  . clopidogrel  75 mg Oral Daily  . donepezil  10 mg Oral QHS  . famotidine  20 mg Oral BID  . mirtazapine  7.5 mg Oral QHS  . nitroGLYCERIN  0.4 mg Transdermal Daily  .  raloxifene  60 mg Oral QHS  . sodium chloride  3 mL Intravenous Q12H  PRN sodium chloride, acetaminophen  Diet:  Cardiac thin and liquids Activity:  Up with assistance DVT Prophylaxis:  SCD  Significant Diagnostic Studies: CBC    Component Value Date/Time   WBC 7.6 04/06/2012 1535   RBC 3.76* 04/06/2012 1535   HGB 10.6* 04/06/2012 1535   HCT 33.0* 04/06/2012 1535   PLT 245 04/06/2012 1535   MCV 87.8 04/06/2012 1535   MCH 28.2 04/06/2012 1535   MCHC 32.1 04/06/2012 1535   RDW 15.1 04/06/2012 1535   LYMPHSABS 1.4 04/06/2012 1259   MONOABS 0.6 04/06/2012 1259   EOSABS 0.1 04/06/2012 1259   BASOSABS 0.0 04/06/2012 1259   CMP    Component Value Date/Time   NA 141 04/06/2012 1318   K 4.1 04/06/2012 1318   CL 106 04/06/2012 1318   CO2 24 04/06/2012 1259   GLUCOSE 92 04/06/2012 1318   BUN 10 04/06/2012 1318   CREATININE 0.90 04/06/2012 1318   CALCIUM 8.6 04/06/2012 1259   PROT 6.2 04/06/2012 1259   ALBUMIN 3.0* 04/06/2012 1259   AST 27 04/06/2012 1259   ALT 10 04/06/2012 1259   ALKPHOS 56 04/06/2012 1259   BILITOT 0.4 04/06/2012 1259   GFRNONAA 63* 04/06/2012 1259   GFRAA 72*  04/06/2012 1259   COAGS Lab Results  Component Value Date   INR 1.07 04/06/2012   INR 1.06 03/23/2012   INR 1.06 04/06/2009   Lipid Panel    Component Value Date/Time   CHOL 147 04/07/2012 0735   TRIG 83 04/07/2012 0735   HDL 57 04/07/2012 0735   CHOLHDL 2.6 04/07/2012 0735   VLDL 17 04/07/2012 0735   LDLCALC 73 04/07/2012 0735   HgbA1C  Lab Results  Component Value Date   HGBA1C 5.7* 04/06/2012   Urine Drug Screen  No results found for this basename: labopia,  cocainscrnur,  labbenz,  amphetmu,  thcu,  labbarb    Alcohol Level No results found for this basename: eth     Results for orders placed during the hospital encounter of 04/06/12 (from the past 24 hour(s))  GLUCOSE, CAPILLARY     Status: Abnormal   Collection Time    04/07/12 11:27 AM      Result Value Range   Glucose-Capillary 146 (*) 70 - 99 mg/dL   Comment 1 Notify RN     Comment 2  Documented in Chart    GLUCOSE, CAPILLARY     Status: None   Collection Time    04/07/12  4:27 PM      Result Value Range   Glucose-Capillary 90  70 - 99 mg/dL   Comment 1 Notify RN     Comment 2 Documented in Chart    GLUCOSE, CAPILLARY     Status: Abnormal   Collection Time    04/07/12  9:23 PM      Result Value Range   Glucose-Capillary 126 (*) 70 - 99 mg/dL  GLUCOSE, CAPILLARY     Status: Abnormal   Collection Time    04/08/12  7:13 AM      Result Value Range   Glucose-Capillary 101 (*) 70 - 99 mg/dL   Comment 1 Documented in Chart     Comment 2 Notify RN      Mri Brain Without Contrast 04/07/12  IMPRESSION: Small area of acute infarct left posterior mid brain.  Atrophy and chronic microvascular ischemia.    MRA HEAD  04/07/12 IMPRESSION: Moderately severe intracranial atherosclerotic disease.  There is a basilar stenosis in the midportion which could account for the mid brain infarct.     CT of the brain   IMPRESSION:  Atrophy and chronic microvascular ischemia. No acute abnormality.   2D Echocardiogram  pending  Carotid Doppler  Preliminary report: There is no ICA stenosis. Vertebral artery flow is antegrade.  CXR  None  EKG  NSR, rate 81, right anterior fascicular block  Physical Exam    The patient is alert and cooperative.  Respiratory examination is clear.  Cardiovascular examination reveals a regular rate and rhythm, no obvious murmurs or rubs are noted.  Neurologic exam reveals full extraocular movements, speech is normal. Visual fields are full.  Motor testing reveals good strength of all four extremities.  The patient has good finger-nose-finger and heel-to-shin bilaterally. Gait was not tested.  Deep tendon reflexes are symmetric and normal. Toes are down going bilaterally.     ASSESSMENT Ms. Angela Mcintosh is a 79 y.o. female with a paramedian pontine stroke, secondary to atherosclerotic vessel changes. On clopidogrel 75 mg orally every  day for secondary stroke prevention.  Stroke risk factors:  hyperlipidemia, coronary artery disease.   Hospital day # 2   Small left paracentral pontine stroke  Intracranial atherosclerosis, mid basilar stenosis  Hyperlipidemia currently on Lipitor  Dementia history currently on Aricept  Coronary artery disease - Plavix and when necessary nitroglycerin   TREATMENT/PLAN The patient likely has sustained a small vessel infarct associated with atherosclerotic changes of the intracranial circulation. The patient does have mid basilar disease that may be the source of the small stroke event. The patient was on Plavix prior to the onset of the stroke; however, the dose had been cut in half secondary to stomach upset. The patient is back on full dose Plavix. The patient's daughter believes her stomach upset was exacerbated by poor PO  intake.  -2D echocardiogram -pending -Carotid Doppler study - no internal carotid artery stenosis -Plavix therapy - continue -Mobilize patient - home health physical and occupational therapies have been recommended.  Delton See PA-C [facility] Pager [phone] 04/08/2012, 9:31 AM    Lesly Dukes

## 2012-04-09 LAB — GLUCOSE, CAPILLARY

## 2012-04-12 ENCOUNTER — Encounter (HOSPITAL_COMMUNITY): Payer: Self-pay | Admitting: *Deleted

## 2012-04-12 ENCOUNTER — Emergency Department (HOSPITAL_COMMUNITY)
Admission: EM | Admit: 2012-04-12 | Discharge: 2012-04-12 | Disposition: A | Discharge status: Home / Self Care | Payer: Medicare Other | Attending: Emergency Medicine | Admitting: Emergency Medicine

## 2012-04-12 DIAGNOSIS — Z7901 Long term (current) use of anticoagulants: Secondary | ICD-10-CM | POA: Insufficient documentation

## 2012-04-12 DIAGNOSIS — I2510 Atherosclerotic heart disease of native coronary artery without angina pectoris: Secondary | ICD-10-CM | POA: Insufficient documentation

## 2012-04-12 DIAGNOSIS — G459 Transient cerebral ischemic attack, unspecified: Secondary | ICD-10-CM | POA: Insufficient documentation

## 2012-04-12 DIAGNOSIS — F068 Other specified mental disorders due to known physiological condition: Secondary | ICD-10-CM | POA: Insufficient documentation

## 2012-04-12 DIAGNOSIS — R5381 Other malaise: Secondary | ICD-10-CM | POA: Insufficient documentation

## 2012-04-12 DIAGNOSIS — Z8673 Personal history of transient ischemic attack (TIA), and cerebral infarction without residual deficits: Secondary | ICD-10-CM | POA: Insufficient documentation

## 2012-04-12 DIAGNOSIS — E7800 Pure hypercholesterolemia, unspecified: Secondary | ICD-10-CM | POA: Insufficient documentation

## 2012-04-12 DIAGNOSIS — Z79899 Other long term (current) drug therapy: Secondary | ICD-10-CM | POA: Insufficient documentation

## 2012-04-12 DIAGNOSIS — R471 Dysarthria and anarthria: Secondary | ICD-10-CM | POA: Insufficient documentation

## 2012-04-12 LAB — COMPREHENSIVE METABOLIC PANEL
AST: 24 U/L (ref 0–37)
Albumin: 2.8 g/dL — ABNORMAL LOW (ref 3.5–5.2)
Calcium: 8.5 mg/dL (ref 8.4–10.5)
Chloride: 107 mEq/L (ref 96–112)
Creatinine, Ser: 0.74 mg/dL (ref 0.50–1.10)
Total Bilirubin: 0.3 mg/dL (ref 0.3–1.2)
Total Protein: 5.9 g/dL — ABNORMAL LOW (ref 6.0–8.3)

## 2012-04-12 LAB — PROTIME-INR: Prothrombin Time: 15.6 seconds — ABNORMAL HIGH (ref 11.6–15.2)

## 2012-04-12 LAB — CBC
MCHC: 31.9 g/dL (ref 30.0–36.0)
MCV: 88.1 fL (ref 78.0–100.0)
Platelets: 272 10*3/uL (ref 150–400)
RDW: 14.9 % (ref 11.5–15.5)
WBC: 5.7 10*3/uL (ref 4.0–10.5)

## 2012-04-12 LAB — APTT: aPTT: 47 seconds — ABNORMAL HIGH (ref 24–37)

## 2012-04-12 MED ORDER — ENOXAPARIN SODIUM 40 MG/0.4ML ~~LOC~~ SOLN
40.0000 mg | Freq: Once | SUBCUTANEOUS | Status: AC
  Administered 2012-04-12: 40 mg via SUBCUTANEOUS
  Filled 2012-04-12: qty 0.4

## 2012-04-12 NOTE — ED Provider Notes (Signed)
History     CSN: [id number]  Arrival date & time 04/12/12  1651   First MD Initiated Contact with Patient 04/12/12 1653      No chief complaint on file.   HPI  The patient p/w her daughter who provides the HPI due to the patient's dementia.  Since a recent admission for stroke, and d/c w coumadin (and cessation of Plavix) the patient was generally well until the hours prior to ED presentation.  Over the past hours the patient has exhibited intermittent Sx: dysarthria, tongue deviation to the R, weakness in R UE and LE extremities. There was no clear precipitant, and since onset Sx seem to have improved w no active interventions.   Past Medical History  Diagnosis Date  . Coronary artery disease   . High cholesterol   . Dementia   . TIA (transient ischemic attack)   . Stroke, small vessel     Past Surgical History  Procedure Laterality Date  . Coronary angioplasty with stent placement      No family history on file.  History  Substance Use Topics  . Smoking status: Never Smoker   . Smokeless tobacco: Not on file  . Alcohol Use: No    OB History   Grav Para Term Preterm Abortions TAB SAB Ect Mult Living                  Review of Systems  Unable to perform ROS: Dementia  Patient's daughter states that she has been generally well since d/c last week.   Allergies  Review of patient's allergies indicates no known allergies.  Home Medications   Current Outpatient Rx  Name  Route  Sig  Dispense  Refill  . Calcium Carbonate-Vitamin D (CALCIUM-VITAMIN D) 600-200 MG-UNIT CAPS   Oral   Take 1 tablet by mouth daily.          . Cholecalciferol (VITAMIN D) 2000 UNITS tablet   Oral   Take 2,000 Units by mouth at bedtime.          . donepezil (ARICEPT) 10 MG tablet   Oral   Take 10 mg by mouth at bedtime.         . famotidine (PEPCID) 20 MG tablet   Oral   Take 20 mg by mouth 2 (two) times daily.         . Iron TABS   Oral   Take 1 tablet by mouth  every morning.          . mirtazapine (REMERON) 7.5 MG tablet   Oral   Take 7.5 mg by mouth at bedtime.         . Multiple Vitamin (MULTIVITAMIN WITH MINERALS) TABS   Oral   Take 1 tablet by mouth daily.         . nitroGLYCERIN (NITRODUR - DOSED IN MG/24 HR) 0.4 mg/hr   Transdermal   Place 1 patch onto the skin daily.         . raloxifene (EVISTA) 60 MG tablet   Oral   Take 60 mg by mouth at bedtime.          . rosuvastatin (CRESTOR) 10 MG tablet   Oral   Take 10 mg by mouth every morning.          . warfarin (COUMADIN) 3 MG tablet   Oral   Take 3 mg by mouth daily at 6 PM.           BP  121/64  Pulse 73  Temp(Src) 97.9 F (36.6 C) (Oral)  Resp 12  SpO2 100%  Physical Exam  Nursing note and vitals reviewed. Constitutional: She is oriented to person, place, and time.  Elderly, sickly appearing female, in no distress  HENT:  Head: Normocephalic and atraumatic.  Eyes: Conjunctivae and EOM are normal. Pupils are equal, round, and reactive to light.  Neck: No tracheal deviation present.  Cardiovascular: Normal rate and regular rhythm.   Murmur heard. Pulmonary/Chest: Effort normal. No stridor. No respiratory distress.  Abdominal: Soft. She exhibits no distension.  Musculoskeletal:  No deformity  Neurological: She is alert and oriented to person, place, and time. She displays atrophy. No cranial nerve deficit. She exhibits abnormal muscle tone. Coordination normal.  The patient has 4/5 strength symmetrically in both lower extremities throughout.  Upper extremity strength is similar, symmetric, unchanged per the patient.  No gross discoordination, visual tracking is appropriate, no focal cranial nerve deficits    ED Course  Procedures (including critical care time)  Labs Reviewed  PROTIME-INR  APTT  CBC  COMPREHENSIVE METABOLIC PANEL  URINALYSIS, ROUTINE W REFLEX MICROSCOPIC  TROPONIN I   No results found.   No diagnosis found.  Labs  demonstrates a therapeutic INR. On repeat exam the patient appears calm.   Date: 04/12/2012  Rate: 69  Rhythm: normal sinus rhythm  QRS Axis: left  Intervals: normal  ST/T Wave abnormalities: nonspecific T wave changes  Conduction Disutrbances:left anterior fascicular block  Narrative Interpretation:   Old EKG Reviewed: unchanged  ABNORMAL  MDM  This pleasant elderly female presents with her daughter provides much of the history of present illness.  Notably, I have seen and evaluated this patient previously, and she was discharged one week ago after an episode of weakness, found to have new ischemic stroke.  The patient was recently switched from Plavix to Coumadin.  I spoke at length with the patient's daughter about the patient's illness, including the need for anticoagulants, the recent evaluation occurred in the hospital, the need for ongoing primary care followup.  We agreed absent current complaints, and with the patient's multiple medical conditions, and the fact that she is not a candidate for acute intervention, and had recent significant evaluation with radiographic studies, she would not have these today.  We discussed the natural course of the patient's illness, even with that medication compliance.  Given the patient's INR was subtherapeutic, she received Lovenox, was instructed to take Coumadin, see her physician in 3 days as scheduled.  Admission versus discharge was discussed at length with the patient's daughter, who prefers discharge        Gerhard Munch, MD 04/12/12 1924

## 2012-04-12 NOTE — ED Notes (Signed)
Pt in from home via family, pt c/o slurred speech & R sided weakness with tongue to the R side of the body as well & weakness, pt hx of similar symptoms, per daughter the pt takes Coumadin, pt A&O x4, follows commands, speaks in complete sentences, pt answers questions appropriately

## 2012-06-19 ENCOUNTER — Other Ambulatory Visit: Payer: Self-pay | Admitting: Geriatric Medicine

## 2012-06-19 ENCOUNTER — Other Ambulatory Visit: Payer: Medicare Other

## 2012-06-19 DIAGNOSIS — I10 Essential (primary) hypertension: Secondary | ICD-10-CM

## 2012-06-19 DIAGNOSIS — D509 Iron deficiency anemia, unspecified: Secondary | ICD-10-CM

## 2012-06-19 DIAGNOSIS — M810 Age-related osteoporosis without current pathological fracture: Secondary | ICD-10-CM

## 2012-06-20 LAB — CBC WITH DIFFERENTIAL/PLATELET
Basophils Absolute: 0 10*3/uL (ref 0.0–0.2)
Basos: 0 % (ref 0–3)
Eos: 2 % (ref 0–5)
Eosinophils Absolute: 0.2 10*3/uL (ref 0.0–0.4)
HCT: 30.9 % — ABNORMAL LOW (ref 34.0–46.6)
Hemoglobin: 9.5 g/dL — ABNORMAL LOW (ref 11.1–15.9)
Immature Grans (Abs): 0 10*3/uL (ref 0.0–0.1)
Immature Granulocytes: 0 % (ref 0–2)
Lymphocytes Absolute: 1.2 10*3/uL (ref 0.7–3.1)
Lymphs: 16 % (ref 14–46)
MCH: 25.9 pg — ABNORMAL LOW (ref 26.6–33.0)
MCHC: 30.7 g/dL — ABNORMAL LOW (ref 31.5–35.7)
MCV: 84 fL (ref 79–97)
Monocytes Absolute: 0.6 10*3/uL (ref 0.1–0.9)
Monocytes: 8 % (ref 4–12)
Neutrophils Absolute: 5.7 10*3/uL (ref 1.4–7.0)
Neutrophils Relative %: 74 % (ref 40–74)
RBC: 3.67 x10E6/uL — ABNORMAL LOW (ref 3.77–5.28)
RDW: 15.5 % — ABNORMAL HIGH (ref 12.3–15.4)
WBC: 7.7 10*3/uL (ref 3.4–10.8)

## 2012-06-20 LAB — BASIC METABOLIC PANEL
BUN/Creatinine Ratio: 15 (ref 11–26)
BUN: 11 mg/dL (ref 10–36)
CO2: 26 mmol/L (ref 19–28)
Calcium: 8.2 mg/dL — ABNORMAL LOW (ref 8.6–10.2)
Chloride: 104 mmol/L (ref 97–108)
Creatinine, Ser: 0.74 mg/dL (ref 0.57–1.00)
GFR calc Af Amer: 82 mL/min/{1.73_m2} (ref >59)
GFR calc non Af Amer: 71 mL/min/{1.73_m2} (ref >59)
Glucose: 91 mg/dL (ref 65–99)
Potassium: 3.9 mmol/L (ref 3.5–5.2)
Sodium: 143 mmol/L (ref 134–144)

## 2012-06-20 LAB — VITAMIN D 25 HYDROXY (VIT D DEFICIENCY, FRACTURES): Vit D, 25-Hydroxy: 83.3 ng/mL (ref 30.0–100.0)

## 2012-06-21 ENCOUNTER — Ambulatory Visit: Payer: Self-pay | Admitting: Internal Medicine

## 2012-06-28 ENCOUNTER — Encounter: Payer: Self-pay | Admitting: Internal Medicine

## 2012-06-28 ENCOUNTER — Ambulatory Visit (INDEPENDENT_AMBULATORY_CARE_PROVIDER_SITE_OTHER): Payer: Medicare Other | Admitting: Internal Medicine

## 2012-06-28 VITALS — BP 110/72 | HR 69 | Temp 98.4°F | Resp 14 | Ht <= 58 in | Wt 95.2 lb

## 2012-06-28 DIAGNOSIS — F32A Depression, unspecified: Secondary | ICD-10-CM

## 2012-06-28 DIAGNOSIS — D509 Iron deficiency anemia, unspecified: Secondary | ICD-10-CM

## 2012-06-28 DIAGNOSIS — E7800 Pure hypercholesterolemia, unspecified: Secondary | ICD-10-CM

## 2012-06-28 DIAGNOSIS — F329 Major depressive disorder, single episode, unspecified: Secondary | ICD-10-CM

## 2012-06-28 DIAGNOSIS — F0153 Vascular dementia, unspecified severity, with mood disturbance: Secondary | ICD-10-CM

## 2012-06-28 DIAGNOSIS — I2510 Atherosclerotic heart disease of native coronary artery without angina pectoris: Secondary | ICD-10-CM

## 2012-06-28 MED ORDER — MIRTAZAPINE 15 MG PO TABS: 15.0000 mg | ORAL_TABLET | Freq: Every day | ORAL | Status: DC

## 2012-06-28 NOTE — Progress Notes (Signed)
Patient ID: Angela Mcintosh, female   DOB: March 10, 1918, 79 y.o.   MRN: [id number] Code Status: DNR, daughter, Arnesia Vincelette, is HCPOA  No Known Allergies  Chief Complaint  Patient presents with  . Medical Managment of Chronic Issues    HPI: Patient is a 79 y.o. seen in the office today for f/u of chronic conditions.  Is freezing cold all of the time.  Urinary incontinence is worse.    Stools remain black.  Is on iron.  Off plavix now.  Remains on coumadin.  Anemia is a bit worse.    Had a week where she was suicidal, but came out of it.  Is on low dose mirtazapine 7.5mg  at bedtime.  Came to sleep in daughter's bed after Easter weekend.  Forgetting family members even her daughter now at times.    Did fall out of daughter's chair once, but did not get hurt.    Her daughter asks me if hospice is a possibility for her b/c she is continuing to decline and has minimal meaningful conversation with her anymore.    Review of Systems:  Review of Systems  Constitutional: Positive for weight loss and malaise/fatigue. Negative for fever.       Always cold  HENT: Negative for congestion.   Eyes: Negative for pain.  Respiratory: Negative for shortness of breath.   Cardiovascular: Negative for chest pain.  Gastrointestinal: Positive for constipation and melena. Negative for heartburn.  Genitourinary: Positive for frequency.       Urinary incontinence  Musculoskeletal: Positive for falls.  Skin: Negative for rash.  Neurological: Negative for loss of consciousness and headaches.  Endo/Heme/Allergies: Bruises/bleeds easily.  Psychiatric/Behavioral: Positive for depression and memory loss.     Past Medical History  Diagnosis Date  . Coronary artery disease   . High cholesterol   . Vascular dementia with depressed mood   . TIA (transient ischemic attack)   . Stroke, small vessel    Past Surgical History  Procedure Laterality Date  . Coronary angioplasty with stent placement      Social History:   reports that she has never smoked. She does not have any smokeless tobacco history on file. She reports that she does not drink alcohol or use illicit drugs.  History reviewed. No pertinent family history.  Medications: Patient's Medications  New Prescriptions   No medications on file  Previous Medications   CALCIUM CARBONATE-VIT D-MIN (CALCIUM 1200) 1200-1000 MG-UNIT CHEW    Take one tablet once daily for calcium   CHOLECALCIFEROL (VITAMIN D) 2000 UNITS TABLET    Take 2,000 Units by mouth at bedtime.    DONEPEZIL (ARICEPT) 10 MG TABLET    Take 10 mg by mouth at bedtime.   FAMOTIDINE (PEPCID) 20 MG TABLET    Take 20 mg by mouth 2 (two) times daily.   IRON TABS    Take 1 tablet by mouth every morning.    MULTIPLE VITAMIN (MULTIVITAMIN WITH MINERALS) TABS    Take 1 tablet by mouth daily.   NITROGLYCERIN (NITRODUR - DOSED IN MG/24 HR) 0.4 MG/HR    Place 1 patch onto the skin daily.   RALOXIFENE (EVISTA) 60 MG TABLET    Take 60 mg by mouth at bedtime.    ROSUVASTATIN (CRESTOR) 10 MG TABLET    Take 10 mg by mouth every morning.    WARFARIN (COUMADIN) 3 MG TABLET    Take 3 mg by mouth daily at 6 PM.  Modified Medications   Modified  Medication Previous Medication   MIRTAZAPINE (REMERON) 15 MG TABLET mirtazapine (REMERON) 7.5 MG tablet      Take 1 tablet (15 mg total) by mouth at bedtime.    Take 7.5 mg by mouth at bedtime.  Discontinued Medications   CALCIUM CARBONATE-VITAMIN D (CALCIUM-VITAMIN D) 600-200 MG-UNIT CAPS    Take 1 tablet by mouth daily.    RALOXIFENE (EVISTA) 60 MG TABLET       Physical Exam: Filed Vitals:   06/28/12 1355  BP: 110/72  Pulse: 69  Temp: 98.4 F (36.9 C)  TempSrc: Oral  Resp: 14  Height: 4\' 10"  (1.473 m)  Weight: 95 lb 3.2 oz (43.182 kg)  Physical Exam  Constitutional: No distress.  Frail caucasian female NAD  HENT:  Head: Normocephalic and atraumatic.  Eyes: Pupils are equal, round, and reactive to light.  Cardiovascular: Normal  rate, regular rhythm, normal heart sounds and intact distal pulses.   Pulmonary/Chest: Effort normal and breath sounds normal.  Abdominal: Soft. Bowel sounds are normal. She exhibits no distension and no mass. There is no tenderness.  Musculoskeletal:  Right-sided weakness (mild)  Neurological: She is alert. No cranial nerve deficit.  Skin: Skin is warm and dry. She is not diaphoretic.  Psychiatric:  Flattened affect, poor short term memory    Labs reviewed: 01/23/2012 CBC; WBC 5.4, RBC 4.22, Hemoglobin 12.0 CMP; Glucose 100, BUN 13, Creatinine 0.98 Iron 34 TSH 1.530 Vitamin B12 and Folate; B12 860, Folate >19.9 04/06/12 Hospital Labs Glucose 105 , BUN 12, Creatinine 0.80 A1c 5.17 WBC 6.6 HGB 10.9 Troponini <0.30  Basic Metabolic Panel:  Recent Labs  16/10/96 1259 04/06/12 1318 04/12/12 1723 06/19/12 1216  NA 139 141 142 143  K 4.3 4.1 3.9 3.9  CL 103 106 107 104  CO2 24  --  27 26  GLUCOSE 105* 92 102* 91  BUN 12 10 13 11   CREATININE 0.80 0.90 0.74 0.74  CALCIUM 8.6  --  8.5 8.2*   Liver Function Tests:  Recent Labs  03/23/12 1749 04/06/12 1259 04/12/12 1723  AST 21 27 24   ALT 7 10 9   ALKPHOS 53 56 50  BILITOT 0.2* 0.4 0.3  PROT 5.8* 6.2 5.9*  ALBUMIN 2.7* 3.0* 2.8*  CBC:  Recent Labs  04/06/12 1259  04/06/12 1535 04/12/12 1723 06/19/12 1216  WBC 6.6  --  7.6 5.7 7.7  NEUTROABS 4.5  --   --   --  5.7  HGB 10.9*  < > 10.6* 10.1* 9.5*  HCT 33.5*  < > 33.0* 31.7* 30.9*  MCV 87.0  --  87.8 88.1 84  PLT 264  --  245 272  --   < > = values in this interval not displayed. Lipid Panel:  Recent Labs  04/07/12 0735  CHOL 147  HDL 57  LDLCALC 73  TRIG 83  CHOLHDL 2.6   Assessment/Plan High cholesterol Is on crestor.  Stable.  At goal 3 mos ago.  Vascular dementia with depressed mood Pt's primary active problem.  This is progressing.  May have mix of vascular and AD.  Remains on coumadin due to ? Prior embolic stroke and frequent TIAs.  She is on  a 1/2 dose of plavix (for CAD) due to her GI bleed history.  She is no longer on aspirin.  Her daughter has asked about hospice care.  I am not certain that despite her weight loss, progression of dementia, falls, incontinence that she will qualify for it; however, her chronically  worsening anemia despite iron supplementation may contribute.  Her daughter is going to read more about [facility] [place].  She continues to lose weight and her mood is bad despite remeron 7.5mg  at bedtime so will try increasing to 15mg .    Coronary artery disease On appropriate meds with plavix (though reduced dose due to GI bleeding), statin.  BP at goal w/o meds.  Has nitrodur for chest pain.  f/u 3 mos.

## 2012-06-28 NOTE — Patient Instructions (Addendum)
[facility]

## 2012-06-30 ENCOUNTER — Encounter: Payer: Self-pay | Admitting: Internal Medicine

## 2012-06-30 DIAGNOSIS — F329 Major depressive disorder, single episode, unspecified: Secondary | ICD-10-CM | POA: Insufficient documentation

## 2012-06-30 DIAGNOSIS — F32A Depression, unspecified: Secondary | ICD-10-CM | POA: Insufficient documentation

## 2012-06-30 DIAGNOSIS — F0153 Vascular dementia, unspecified severity, with mood disturbance: Secondary | ICD-10-CM | POA: Insufficient documentation

## 2012-06-30 NOTE — Assessment & Plan Note (Addendum)
Is on crestor.  Stable.  At goal 3 mos ago.

## 2012-06-30 NOTE — Assessment & Plan Note (Addendum)
Pt's primary active problem.  This is progressing.  May have mix of vascular and AD.  Remains on coumadin due to ? Prior embolic stroke and frequent TIAs.  She is on a 1/2 dose of plavix (for CAD) due to her GI bleed history.  She is no longer on aspirin.  Her daughter has asked about hospice care.  I am not certain that despite her weight loss, progression of dementia, falls, incontinence that she will qualify for it; however, her chronically worsening anemia despite iron supplementation may contribute.  Her daughter is going to read more about [facility] [place].  She continues to lose weight and her mood is bad despite remeron 7.5mg  at bedtime so will try increasing to 15mg .

## 2012-06-30 NOTE — Assessment & Plan Note (Signed)
On appropriate meds with plavix (though reduced dose due to GI bleeding), statin.  BP at goal w/o meds.  Has nitrodur for chest pain.

## 2012-08-10 ENCOUNTER — Other Ambulatory Visit: Payer: Self-pay | Admitting: Internal Medicine

## 2012-08-17 ENCOUNTER — Other Ambulatory Visit: Payer: Self-pay | Admitting: Geriatric Medicine

## 2012-08-17 MED ORDER — DONEPEZIL HCL 10 MG PO TABS: 10.0000 mg | ORAL_TABLET | Freq: Every day | ORAL | Status: DC

## 2012-09-13 ENCOUNTER — Other Ambulatory Visit: Payer: Self-pay | Admitting: Geriatric Medicine

## 2012-09-13 DIAGNOSIS — F329 Major depressive disorder, single episode, unspecified: Secondary | ICD-10-CM

## 2012-09-13 DIAGNOSIS — F0153 Vascular dementia, unspecified severity, with mood disturbance: Secondary | ICD-10-CM

## 2012-09-13 MED ORDER — MIRTAZAPINE 15 MG PO TABS: 15.0000 mg | ORAL_TABLET | Freq: Every day | ORAL | Status: DC

## 2012-09-13 MED ORDER — DONEPEZIL HCL 10 MG PO TABS: 10.0000 mg | ORAL_TABLET | Freq: Every day | ORAL | Status: DC

## 2012-10-04 ENCOUNTER — Ambulatory Visit: Payer: Medicare Other | Admitting: Internal Medicine

## 2012-10-11 ENCOUNTER — Ambulatory Visit (INDEPENDENT_AMBULATORY_CARE_PROVIDER_SITE_OTHER): Payer: Medicare Other | Admitting: Internal Medicine

## 2012-10-11 ENCOUNTER — Encounter: Payer: Self-pay | Admitting: Internal Medicine

## 2012-10-11 VITALS — BP 118/78 | HR 51 | Temp 98.9°F | Resp 14 | Ht <= 58 in | Wt 95.2 lb

## 2012-10-11 DIAGNOSIS — F0151 Vascular dementia with behavioral disturbance: Secondary | ICD-10-CM

## 2012-10-11 DIAGNOSIS — M810 Age-related osteoporosis without current pathological fracture: Secondary | ICD-10-CM

## 2012-10-11 DIAGNOSIS — F32A Depression, unspecified: Secondary | ICD-10-CM

## 2012-10-11 DIAGNOSIS — I10 Essential (primary) hypertension: Secondary | ICD-10-CM

## 2012-10-11 DIAGNOSIS — F329 Major depressive disorder, single episode, unspecified: Secondary | ICD-10-CM

## 2012-10-11 DIAGNOSIS — D509 Iron deficiency anemia, unspecified: Secondary | ICD-10-CM

## 2012-10-11 DIAGNOSIS — F0153 Vascular dementia, unspecified severity, with mood disturbance: Secondary | ICD-10-CM

## 2012-10-11 NOTE — Patient Instructions (Signed)
Let me know if you want a referral for home health.

## 2012-10-11 NOTE — Progress Notes (Signed)
Patient ID: Angela Mcintosh, female   DOB: 1919/02/04, 79 y.o.   MRN: [id number] Location:  [facility]  Code Status: DNR   No Known Allergies  Chief Complaint  Patient presents with  . Medical Managment of Chronic Issues    HPI: Patient is a 79 y.o. black female seen in the office today for regular visit.   Doing fine.  Every day she says it is the worst day.  Memory completely shot.   Did go to audiology--had cerumen impaction again and had flushes. Went for bloodwork with Dr. Lurene Shadow on hold due to supratherapeutic level today.  Her daughter says we are to manage her coumadin now.  Dr. Sharyn Lull has already given the orders, however.   Not wanting to wear dentures due to refusing fixodent to hold them in b/c she wants to clean them all of the time.  Had been eating better and went up to 98 lbs while keeping teeth in but now lost 3 again.   Pt thinks her mood is good--daughter says she has her company mood on. Says her only discomfort she gets is in her neck and upper spine once in a while.  Still c/o tummy aches at times.  Spending a lot of time in bed.  Not eating regular meals.  Says she is lazy.  Even slept in clothes a couple times.   Needs help out of chair and the car.  Daughter sometimes helps with dressing and hygiene.  Is having urinary and fecal incontinence.    Review of Systems:  Review of Systems  Constitutional: Positive for weight loss and malaise/fatigue. Negative for fever and chills.  HENT: Positive for hearing loss. Negative for ear pain and congestion.   Eyes: Negative for blurred vision.  Respiratory: Negative for cough, shortness of breath and wheezing.   Cardiovascular: Negative for chest pain and leg swelling.  Gastrointestinal: Negative for heartburn and constipation.  Genitourinary: Negative for dysuria.  Musculoskeletal: Negative for myalgias and falls.  Skin: Negative for rash.  Psychiatric/Behavioral:  Positive for depression and memory loss.     Past Medical History  Diagnosis Date  . Coronary artery disease   . High cholesterol   . Vascular dementia with depressed mood   . TIA (transient ischemic attack)   . Stroke, small vessel     Past Surgical History  Procedure Laterality Date  . Coronary angioplasty with stent placement      Social History:   reports that she has never smoked. She does not have any smokeless tobacco history on file. She reports that she does not drink alcohol or use illicit drugs.  No family history on file.  Medications: Patient's Medications  New Prescriptions   No medications on file  Previous Medications   CALCIUM CARBONATE-VIT D-MIN (CALCIUM 1200) 1200-1000 MG-UNIT CHEW    Take one tablet once daily for calcium   CHOLECALCIFEROL (VITAMIN D) 2000 UNITS TABLET    Take 2,000 Units by mouth at bedtime.    FAMOTIDINE (PEPCID) 20 MG TABLET    Take 20 mg by mouth 2 (two) times daily.   IRON TABS    Take 1 tablet by mouth every morning.    MIRTAZAPINE (REMERON) 15 MG TABLET    Take 1 tablet (15 mg total) by mouth at bedtime.   MULTIPLE VITAMIN (MULTIVITAMIN WITH MINERALS) TABS    Take 1 tablet by mouth daily.   NITROGLYCERIN (NITRODUR - DOSED IN MG/24 HR) 0.4 MG/HR  Place 1 patch onto the skin daily.   RALOXIFENE (EVISTA) 60 MG TABLET    Take 60 mg by mouth at bedtime.    ROSUVASTATIN (CRESTOR) 10 MG TABLET    Take 10 mg by mouth every morning.    WARFARIN (COUMADIN) 3 MG TABLET    Take 3 mg by mouth daily at 6 PM.  Modified Medications   Modified Medication Previous Medication   DONEPEZIL (ARICEPT) 10 MG TABLET donepezil (ARICEPT) 10 MG tablet          TAKE 1 TABLET BY MOUTH DAILY FOR MEMORY  Discontinued Medications   DONEPEZIL (ARICEPT) 10 MG TABLET    Take 1 tablet (10 mg total) by mouth at bedtime.     Physical Exam: Filed Vitals:   10/11/12 1458  BP: 118/78  Pulse: 51  Temp: 98.9 F (37.2 C)  TempSrc: Oral  Resp: 14  Height: 4'  10" (1.473 m)  Weight: 95 lb 3.2 oz (43.182 kg)  Physical Exam  Constitutional: No distress.  Frail black female  HENT:  Head: Normocephalic and atraumatic.  Mouth/Throat: No oropharyngeal exudate.  Eyes: EOM are normal. Pupils are equal, round, and reactive to light.  Wears glasses  Cardiovascular: Normal rate, regular rhythm, normal heart sounds and intact distal pulses.   Pulmonary/Chest: Effort normal and breath sounds normal. No respiratory distress.  Abdominal: Soft. Bowel sounds are normal. She exhibits no distension. There is no tenderness.  Musculoskeletal: Normal range of motion. She exhibits no edema and no tenderness.  Neurological: She is alert.  Oriented to person only  Skin: Skin is warm and dry.     Labs reviewed: Basic Metabolic Panel:  Recent Labs  24/40/10 1259 04/06/12 1318 04/12/12 1723 06/19/12 1216  NA 139 141 142 143  K 4.3 4.1 3.9 3.9  CL 103 106 107 104  CO2 24  --  27 26  GLUCOSE 105* 92 102* 91  BUN 12 10 13 11   CREATININE 0.80 0.90 0.74 0.74  CALCIUM 8.6  --  8.5 8.2*   Liver Function Tests:  Recent Labs  03/23/12 1749 04/06/12 1259 04/12/12 1723  AST 21 27 24   ALT 7 10 9   ALKPHOS 53 56 50  BILITOT 0.2* 0.4 0.3  PROT 5.8* 6.2 5.9*  ALBUMIN 2.7* 3.0* 2.8*   CBC:  Recent Labs  04/06/12 1259  04/06/12 1535 04/12/12 1723 06/19/12 1216  WBC 6.6  --  7.6 5.7 7.7  NEUTROABS 4.5  --   --   --  5.7  HGB 10.9*  < > 10.6* 10.1* 9.5*  HCT 33.5*  < > 33.0* 31.7* 30.9*  MCV 87.0  --  87.8 88.1 84  PLT 264  --  245 272  --   < > = values in this interval not displayed. Lipid Panel:  Recent Labs  04/07/12 0735  CHOL 147  HDL 57  LDLCALC 73  TRIG 83  CHOLHDL 2.6   Lab Results  Component Value Date   HGBA1C 5.7* 04/06/2012   Assessment/Plan 1. Vascular dementia with depressed mood Will plan to add namenda XR next time when it's not on back order. Discussed [facility] but pt is not interested in attending--does not really like  leaving home or socializing    Ambulatory referral to Home Health for aide services, coumadin checks to be faxed to our office with current dosing, PT, OT if patient accepts it  2. Senile osteoporosis On calcium with vitamin D routinely Has GERD Not currently on  bisphosphonate  3. Essential hypertension, benign Bp definitely is at goal at this point  4. Anemia, iron deficiency Mild anemia On iron replacement  Labs/tests ordered:  INR on Monday, 10/15/12 by home health Next appt:  3 mos.

## 2012-10-19 DIAGNOSIS — F0151 Vascular dementia with behavioral disturbance: Secondary | ICD-10-CM

## 2012-10-19 DIAGNOSIS — M810 Age-related osteoporosis without current pathological fracture: Secondary | ICD-10-CM

## 2012-10-19 DIAGNOSIS — F0153 Vascular dementia, unspecified severity, with mood disturbance: Secondary | ICD-10-CM

## 2012-10-19 DIAGNOSIS — M6281 Muscle weakness (generalized): Secondary | ICD-10-CM

## 2012-10-19 DIAGNOSIS — F329 Major depressive disorder, single episode, unspecified: Secondary | ICD-10-CM

## 2012-10-19 DIAGNOSIS — F32A Depression, unspecified: Secondary | ICD-10-CM

## 2012-10-19 DIAGNOSIS — R262 Difficulty in walking, not elsewhere classified: Secondary | ICD-10-CM

## 2012-10-20 ENCOUNTER — Other Ambulatory Visit: Payer: Self-pay | Admitting: Internal Medicine

## 2012-10-31 ENCOUNTER — Telehealth: Payer: Self-pay

## 2012-10-31 NOTE — Telephone Encounter (Signed)
Message left on triage VM, patient with suicidal thoughts and continues to say she wish someone would kill her, patient has these thoughts often and usually gets over it, please call to discuss.  I called patient's daughter and recommended that she take patient to [facility] or call 911 if patient will not allow her to take her to [facility]. Patient will need to be evaluated by [facility]. Patient's daughter Clydie Braun agreed and verbalized understanding for the need that patient be seen and evaluated.    Message will be sent to PCP as a FYI and for additional recommendations if any

## 2012-11-01 NOTE — Telephone Encounter (Signed)
I agree that she needs to be seen promptly by behavioral health.  Thank you.

## 2012-11-01 NOTE — Telephone Encounter (Signed)
I reviewed the system and did not see that patient was seen. I called the daughter to follow-up, I informed her that Dr.Reed as well agrees that patient needs to be evaluated for suicidal behavior. Patient's daughter indicates she will take her to [facility] for evaluation.

## 2012-11-13 ENCOUNTER — Telehealth: Payer: Self-pay | Admitting: Internal Medicine

## 2012-11-13 NOTE — Telephone Encounter (Signed)
Please see phone note above.  Pt needs appointment Thurs morning.

## 2012-11-13 NOTE — Telephone Encounter (Signed)
Patient scheduled for Thursday at 8:30 per physician request

## 2012-11-15 ENCOUNTER — Encounter: Payer: Self-pay | Admitting: Internal Medicine

## 2012-11-15 ENCOUNTER — Ambulatory Visit (INDEPENDENT_AMBULATORY_CARE_PROVIDER_SITE_OTHER): Payer: Medicare Other | Admitting: Internal Medicine

## 2012-11-15 VITALS — BP 130/80 | HR 95 | Temp 98.4°F | Resp 16 | Ht <= 58 in | Wt 96.4 lb

## 2012-11-15 DIAGNOSIS — D509 Iron deficiency anemia, unspecified: Secondary | ICD-10-CM

## 2012-11-15 DIAGNOSIS — R197 Diarrhea, unspecified: Secondary | ICD-10-CM

## 2012-11-15 DIAGNOSIS — F0153 Vascular dementia, unspecified severity, with mood disturbance: Secondary | ICD-10-CM

## 2012-11-15 DIAGNOSIS — F0151 Vascular dementia with behavioral disturbance: Secondary | ICD-10-CM

## 2012-11-15 DIAGNOSIS — F32A Depression, unspecified: Secondary | ICD-10-CM

## 2012-11-15 NOTE — Patient Instructions (Signed)
Stop aricept (donepezil) to see if this helps improve the diarrhea.

## 2012-11-15 NOTE — Progress Notes (Signed)
Patient ID: Angela Mcintosh, female   DOB: [birth year], 79 y.o.   MRN: [id number] Location:  [facility]  Code Status: DNR   No Known Allergies  Chief Complaint  Patient presents with  . Acute Visit    diarrhea,long term,    HPI: Patient is a 79 y.o. black female seen in the office today for diarrhea. Has been an ongoing problem for about 1 year. Some days its several times a day with diarrhea episodes. Pt. Complains of stomach upset. Have tried zantac, baking soda and peptobismol. Tried anti-diarrheal medication two days ago and that has helped. Pt. Eats half a kid's meal, all meals consist of very little portions per daughter. Daughter states that she can drink a 7.5 oz can of soda for the entire day.   Pt. Sits in her chair mostly and likes to ride in the car. Daughter states she does not want to interact with people. Daughter went to a conference and pt was left with respite care. Pt. Refused to stay with them stating they were boring.  She has had periods of suicidal ideation and even a plan recently to slit her own throat.  Her daughter said the mood came and went so she did not take her to the emergency behavioral center as recommended.    Review of Systems:  Review of Systems  Constitutional: Negative for weight loss.  Respiratory: Negative for shortness of breath.   Cardiovascular: Negative for chest pain.  Gastrointestinal: Positive for abdominal pain and diarrhea.       Abdminal pain per daughter- states she complains at home   Musculoskeletal: Positive for falls.       Labor day weekend but no injuries  Psychiatric/Behavioral: Positive for depression and memory loss.       Does not want to interact with others     Past Medical History  Diagnosis Date  . Coronary artery disease   . High cholesterol   . Vascular dementia with depressed mood   . TIA (transient ischemic attack)   . Stroke, small vessel     Past Surgical History   Procedure Laterality Date  . Coronary angioplasty with stent placement      Social History:   reports that she has never smoked. She does not have any smokeless tobacco history on file. She reports that she does not drink alcohol or use illicit drugs.  No family history on file.  Medications: Patient's Medications  New Prescriptions   No medications on file  Previous Medications   CALCIUM CARBONATE-VIT D-MIN (CALCIUM 1200) 1200-1000 MG-UNIT CHEW    Take one tablet once daily for calcium   CHOLECALCIFEROL (VITAMIN D) 2000 UNITS TABLET    Take 2,000 Units by mouth at bedtime.    DONEPEZIL (ARICEPT) 10 MG TABLET    Take 10 mg by mouth at bedtime.    FAMOTIDINE (PEPCID) 20 MG TABLET    Take 20 mg by mouth 2 (two) times daily.   IRON TABS    Take 1 tablet by mouth every morning.    MIRTAZAPINE (REMERON) 15 MG TABLET    TAKE 1 TABLET BY MOUTH AT BEDTIME   MULTIPLE VITAMIN (MULTIVITAMIN WITH MINERALS) TABS    Take 1 tablet by mouth daily.   NITROGLYCERIN (NITRODUR - DOSED IN MG/24 HR) 0.4 MG/HR    Place 1 patch onto the skin daily.   RALOXIFENE (EVISTA) 60 MG TABLET    Take 60 mg by mouth  at bedtime.    ROSUVASTATIN (CRESTOR) 10 MG TABLET    Take 10 mg by mouth every morning.    WARFARIN (COUMADIN) 3 MG TABLET    Take 3 mg by mouth daily at 6 PM.  Modified Medications   No medications on file  Discontinued Medications   No medications on file     Physical Exam: Filed Vitals:   11/15/12 0835  BP: 130/80  Pulse: 95  Temp: 98.4 F (36.9 C)  TempSrc: Oral  Resp: 16  Height: 4\' 10"  (1.473 m)  Weight: 96 lb 6.4 oz (43.727 kg)  SpO2: 95%   Physical Exam  Constitutional: She appears well-developed.  Cardiovascular: Normal rate, regular rhythm, normal heart sounds and intact distal pulses.   Pulmonary/Chest: Effort normal and breath sounds normal.  Abdominal: Soft. Bowel sounds are normal. She exhibits no distension and no mass. There is tenderness. There is no rebound and no  guarding. No hernia.  RUQ tenderness  Neurological: She is alert.  Skin: Skin is warm and dry.  Psychiatric: She has a normal mood and affect.   Labs reviewed: Basic Metabolic Panel:  Recent Labs  16/10/96 1259 04/06/12 1318 04/12/12 1723 06/19/12 1216  NA 139 141 142 143  K 4.3 4.1 3.9 3.9  CL 103 106 107 104  CO2 24  --  27 26  GLUCOSE 105* 92 102* 91  BUN 12 10 13 11   CREATININE 0.80 0.90 0.74 0.74  CALCIUM 8.6  --  8.5 8.2*   Liver Function Tests:  Recent Labs  03/23/12 1749 04/06/12 1259 04/12/12 1723  AST 21 27 24   ALT 7 10 9   ALKPHOS 53 56 50  BILITOT 0.2* 0.4 0.3  PROT 5.8* 6.2 5.9*  ALBUMIN 2.7* 3.0* 2.8*  CBC:  Recent Labs  04/06/12 1259  04/06/12 1535 04/12/12 1723 06/19/12 1216  WBC 6.6  --  7.6 5.7 7.7  NEUTROABS 4.5  --   --   --  5.7  HGB 10.9*  < > 10.6* 10.1* 9.5*  HCT 33.5*  < > 33.0* 31.7* 30.9*  MCV 87.0  --  87.8 88.1 84  PLT 264  --  245 272  --   < > = values in this interval not displayed. Lipid Panel:  Recent Labs  04/07/12 0735  CHOL 147  HDL 57  LDLCALC 73  TRIG 83  CHOLHDL 2.6   Lab Results  Component Value Date   HGBA1C 5.7* 04/06/2012   Assessment/Plan 1. Vascular dementia with depressed mood -recommended they see Dr. Donell Beers, psychiatry, due to off and on difficulty with suicidal ideation--not active today--in good spirits at this visit -is on mirtazapine 15mg  at bedtime  2. Diarrhea -will stop aricept to see if diarrhea improves -if not, will try to decrease iron   3. Anemia, iron deficiency -continue iron unless no improvement in diarrhea    Next appt: 3 mos

## 2012-11-22 ENCOUNTER — Emergency Department (HOSPITAL_COMMUNITY): Payer: Medicare Other

## 2012-11-22 ENCOUNTER — Emergency Department (HOSPITAL_COMMUNITY)
Admission: EM | Admit: 2012-11-22 | Discharge: 2012-11-22 | Disposition: A | Discharge status: Home / Self Care | Payer: Medicare Other | Attending: Emergency Medicine | Admitting: Emergency Medicine

## 2012-11-22 ENCOUNTER — Encounter (HOSPITAL_COMMUNITY): Payer: Self-pay

## 2012-11-22 DIAGNOSIS — R296 Repeated falls: Secondary | ICD-10-CM | POA: Insufficient documentation

## 2012-11-22 DIAGNOSIS — Z79899 Other long term (current) drug therapy: Secondary | ICD-10-CM | POA: Insufficient documentation

## 2012-11-22 DIAGNOSIS — Y9301 Activity, walking, marching and hiking: Secondary | ICD-10-CM | POA: Insufficient documentation

## 2012-11-22 DIAGNOSIS — M129 Arthropathy, unspecified: Secondary | ICD-10-CM | POA: Insufficient documentation

## 2012-11-22 DIAGNOSIS — F0151 Vascular dementia with behavioral disturbance: Secondary | ICD-10-CM | POA: Insufficient documentation

## 2012-11-22 DIAGNOSIS — S0181XA Laceration without foreign body of other part of head, initial encounter: Secondary | ICD-10-CM

## 2012-11-22 DIAGNOSIS — Z8673 Personal history of transient ischemic attack (TIA), and cerebral infarction without residual deficits: Secondary | ICD-10-CM | POA: Insufficient documentation

## 2012-11-22 DIAGNOSIS — I2510 Atherosclerotic heart disease of native coronary artery without angina pectoris: Secondary | ICD-10-CM | POA: Insufficient documentation

## 2012-11-22 DIAGNOSIS — Y92009 Unspecified place in unspecified non-institutional (private) residence as the place of occurrence of the external cause: Secondary | ICD-10-CM | POA: Insufficient documentation

## 2012-11-22 DIAGNOSIS — Z7901 Long term (current) use of anticoagulants: Secondary | ICD-10-CM | POA: Insufficient documentation

## 2012-11-22 DIAGNOSIS — F0153 Vascular dementia, unspecified severity, with mood disturbance: Secondary | ICD-10-CM | POA: Insufficient documentation

## 2012-11-22 DIAGNOSIS — S0180XA Unspecified open wound of other part of head, initial encounter: Secondary | ICD-10-CM | POA: Insufficient documentation

## 2012-11-22 DIAGNOSIS — E7800 Pure hypercholesterolemia, unspecified: Secondary | ICD-10-CM | POA: Insufficient documentation

## 2012-11-22 DIAGNOSIS — M542 Cervicalgia: Secondary | ICD-10-CM | POA: Insufficient documentation

## 2012-11-22 NOTE — ED Notes (Signed)
Pt fell this morning and her chin hit her cane and has laceration to chin. Denies being dizzy sts just tripped over her feet per daughter,this does happen often.

## 2012-11-22 NOTE — ED Provider Notes (Signed)
CSN: [id number]     Arrival date & time 11/22/12  1035 History   First MD Initiated Contact with Patient 11/22/12 1142     Chief Complaint  Patient presents with  . Fall  . Facial Laceration    HPI  Patient got up during the night. She was walking to the bathroom. She lost her balance and fell. She her chin against the top of 13. She had some bleeding. Her daughter went in to check on her. She put her back in the bed. She was not unconscious. She had no complaints. Daughter states she put a bandage on the chin. Should there were closed this morning and felt it needed attention.  There is no complaint of a headache or neck pain or back pain or any areas of pain or concern or injury other than the chin.  No nausea, no vomiting, she has had a history of dementia. Daughter feels she is at her baseline. She is interactive here.  Past Medical History  Diagnosis Date  . Coronary artery disease   . High cholesterol   . Vascular dementia with depressed mood   . TIA (transient ischemic attack)   . Stroke, small vessel    Past Surgical History  Procedure Laterality Date  . Coronary angioplasty with stent placement     History reviewed. No pertinent family history. History  Substance Use Topics  . Smoking status: Never Smoker   . Smokeless tobacco: Not on file  . Alcohol Use: No   OB History   Grav Para Term Preterm Abortions TAB SAB Ect Mult Living                 Review of Systems  Constitutional: Negative for fever, chills, diaphoresis, appetite change and fatigue.  HENT: Negative for sore throat, mouth sores and trouble swallowing.   Eyes: Negative for visual disturbance.  Respiratory: Negative for cough, chest tightness, shortness of breath and wheezing.   Cardiovascular: Negative for chest pain.  Gastrointestinal: Negative for nausea, vomiting, abdominal pain, diarrhea and abdominal distention.  Endocrine: Negative for polydipsia, polyphagia and polyuria.  Genitourinary:  Negative for dysuria, frequency and hematuria.  Musculoskeletal: Negative for gait problem.  Skin: Positive for wound. Negative for color change, pallor and rash.       Chin laceration  Neurological: Negative for dizziness, syncope, light-headedness and headaches.  Hematological: Does not bruise/bleed easily.  Psychiatric/Behavioral: Negative for behavioral problems and confusion.    Allergies  Review of patient's allergies indicates no known allergies.  Home Medications   Current Outpatient Rx  Name  Route  Sig  Dispense  Refill  . Calcium Carbonate-Vit D-Min (CALCIUM 1200) 1200-1000 MG-UNIT CHEW   Oral   Chew 1 tablet by mouth daily.          . Cholecalciferol (VITAMIN D) 2000 UNITS tablet   Oral   Take 2,000 Units by mouth at bedtime.          . famotidine (PEPCID) 20 MG tablet   Oral   Take 20 mg by mouth 2 (two) times daily.         . Iron TABS   Oral   Take 1 tablet by mouth every morning.          . mirtazapine (REMERON) 15 MG tablet   Oral   Take 15 mg by mouth at bedtime.         . Multiple Vitamin (MULTIVITAMIN WITH MINERALS) TABS   Oral   Take 1  tablet by mouth daily.         . nitroGLYCERIN (NITRODUR - DOSED IN MG/24 HR) 0.4 mg/hr   Transdermal   Place 1 patch onto the skin daily.         . raloxifene (EVISTA) 60 MG tablet   Oral   Take 60 mg by mouth at bedtime.          . rosuvastatin (CRESTOR) 10 MG tablet   Oral   Take 10 mg by mouth every evening.          . warfarin (COUMADIN) 3 MG tablet   Oral   Take 1.5-3 mg by mouth daily at 6 PM. 1.5 mg (half tablet) on Sundays and 3 mg (one tablet) the rest of the week.          BP 116/61  Pulse 62  Temp(Src) 99 F (37.2 C) (Oral)  Resp 16  Ht 4\' 10"  (1.473 m)  Wt 96 lb 4 oz (43.659 kg)  BMI 20.12 kg/m2  SpO2 98% Physical Exam  Constitutional: She is oriented to person, place, and time. She appears well-developed and well-nourished. No distress.  HENT:  Head:  Normocephalic.  Nontender in the scalp and skull. No blood over the TMs, mastoids, or from ears nose or mouth.  Eyes: Conjunctivae are normal. Pupils are equal, round, and reactive to light. No scleral icterus.  Neck: No thyromegaly present.  She complains of pain with movement of the neck. There is no reproducible tenderness along the spinous processes posteriorly.  Cardiovascular: Normal rate and regular rhythm.  Exam reveals no gallop and no friction rub.   No murmur heard. Pulmonary/Chest: Effort normal and breath sounds normal. No respiratory distress. She has no wheezes. She has no rales.  Abdominal: Soft. Bowel sounds are normal. She exhibits no distension. There is no tenderness. There is no rebound.  Musculoskeletal: Normal range of motion.  Neurological: She is alert and oriented to person, place, and time.  Answers most questions appropriately. She does have to her daughter for reassurance regarding her answers. She is oriented x3. She is uncertain of the date. She knows she is at a hospital. She has normal strength to the upper extremities without pronator drift and normal use normal grip strength. She has a normal but slow steady gait.  Skin: Skin is warm and dry. No rash noted.  3 cm curvilinear submental chin laceration  Psychiatric: She has a normal mood and affect. Her behavior is normal.    ED Course  LACERATION REPAIR Date/Time: 11/22/2012 1:36 PM Performed by: Roney Marion Authorized by: Rolland Porter J Consent: Verbal consent obtained. Consent given by: patient Patient identity confirmed: verbally with patient Body area: head/neck (Submental chin) Location details: chin Laceration length: 3 cm Vascular damage: no Anesthesia: local infiltration Irrigation solution: saline Debridement: none Degree of undermining: none Wound skin closure material used: Dermabond. Patient tolerance: Patient tolerated the procedure well with no immediate complications.   Labs  Review Labs Reviewed - No data to display Imaging Review Ct Cervical Spine Wo Contrast  11/22/2012   CLINICAL DATA:  Fall, neck pain  EXAM: CT CERVICAL SPINE WITHOUT CONTRAST  TECHNIQUE: Multidetector CT imaging of the cervical spine was performed without intravenous contrast. Multiplanar CT image reconstructions were also generated.  COMPARISON:  08/24/2010  FINDINGS: Axial images of the cervical spine shows no acute fracture or subluxation. Computer processed images demonstrate no acute fracture or subluxation. Stable mild anterolisthesis C2 on C3 vertebral body. Again noted  significant disc space flattening with mild anterior and mild posterior spurring at C3-C4 , C4-C5 level. Moderate disc space flattening with mild anterior spurring at C4-C5 and C5-C6 level. No prevertebral soft tissue swelling. Cervical airway is patent. Stable degenerative changes C1-C2 articulation.  There is no pneumothorax in visualized lung apices. Bilateral apical scarring.  IMPRESSION: No acute fracture or subluxation. Stable degenerative changes as described above. Again noted mild anterolisthesis C2 on C3 vertebral body.   Electronically Signed   By: Natasha Mead   On: 11/22/2012 13:18    MDM   1. Chin laceration, initial encounter    Patient is appropriate for discharge. Remains with normal mental status. Plan is basic wound care. Closed head injury information sheet was given. No sign of significant blow to the head at this time.    Roney Marion, MD 11/22/12 [phone]

## 2012-11-22 NOTE — ED Notes (Signed)
Pt brought in by daughter. Presents with 2 cm laceration to chin. Area cleaned. No bleeding noted. Pt denies pain. Neuro intact. Daughter reports pt has falls, but 3 falls in last 24 hours. Pt placed on high fall risk.

## 2012-11-23 ENCOUNTER — Ambulatory Visit (INDEPENDENT_AMBULATORY_CARE_PROVIDER_SITE_OTHER): Payer: Medicare Other | Admitting: Family Medicine

## 2012-11-23 VITALS — BP 94/66 | HR 102 | Temp 98.6°F | Resp 18 | Ht 58.75 in | Wt 98.3 lb

## 2012-11-23 DIAGNOSIS — IMO0002 Reserved for concepts with insufficient information to code with codable children: Secondary | ICD-10-CM

## 2012-11-23 DIAGNOSIS — S0180XS Unspecified open wound of other part of head, sequela: Secondary | ICD-10-CM

## 2012-11-23 NOTE — Patient Instructions (Addendum)
Tried to keep wound covered until Wednesday. At that point if the glue peels on off it will be fine. Return if problems

## 2012-11-23 NOTE — Progress Notes (Signed)
Subjective: Patient fell and hurt her chin, and yesterday was taken to the emergency room. She apparently it was repaired with Dermabond. She keeps spitting at, and came in today wanting the dressing changed.  Objective: Partially peeling back Dermabond on the chin. I clipped off the margin that she is picking at. I then referred her with similar Dermabond. It did burn transiently. A large Band-Aid was used to cover the wound. The daughter was present with her.  Assessment: Wound to chin  Plan: Dermabond coating, followup if further problems, keep a Band-Aid on since she won't keep picking at it

## 2013-02-14 ENCOUNTER — Encounter: Payer: Self-pay | Admitting: Internal Medicine

## 2013-02-14 ENCOUNTER — Ambulatory Visit (INDEPENDENT_AMBULATORY_CARE_PROVIDER_SITE_OTHER): Payer: Medicare Other | Admitting: Internal Medicine

## 2013-02-14 VITALS — BP 104/62 | HR 98 | Temp 99.1°F | Resp 12 | Wt 90.0 lb

## 2013-02-14 DIAGNOSIS — R634 Abnormal weight loss: Secondary | ICD-10-CM

## 2013-02-14 DIAGNOSIS — F0151 Vascular dementia with behavioral disturbance: Secondary | ICD-10-CM

## 2013-02-14 DIAGNOSIS — R7309 Other abnormal glucose: Secondary | ICD-10-CM

## 2013-02-14 DIAGNOSIS — F32A Depression, unspecified: Secondary | ICD-10-CM

## 2013-02-14 DIAGNOSIS — F0153 Vascular dementia, unspecified severity, with mood disturbance: Secondary | ICD-10-CM

## 2013-02-14 DIAGNOSIS — R739 Hyperglycemia, unspecified: Secondary | ICD-10-CM

## 2013-02-14 DIAGNOSIS — I10 Essential (primary) hypertension: Secondary | ICD-10-CM

## 2013-02-14 DIAGNOSIS — D509 Iron deficiency anemia, unspecified: Secondary | ICD-10-CM

## 2013-02-14 DIAGNOSIS — I2510 Atherosclerotic heart disease of native coronary artery without angina pectoris: Secondary | ICD-10-CM

## 2013-02-14 DIAGNOSIS — M810 Age-related osteoporosis without current pathological fracture: Secondary | ICD-10-CM

## 2013-02-14 NOTE — Progress Notes (Signed)
Patient ID: Angela Mcintosh, female   DOB: 03-15-1918, 79 y.o.   MRN: [id number]   Location:  [facility]  Code Status: DNR   No Known Allergies  Chief Complaint  Patient presents with  . Medical Managment of Chronic Issues    3 month follow-up   . Fall    Fell Monday, no injury. Not using walker/cane as directed   . Neck Pain    ? arthritis   . Altered Mental Status    Increased confusion x 1 month     HPI: Patient is a 79 y.o. black female seen in the office today for medical mgt of chronic diseases.    More frequent sneezing fits past few days.    Fell Monday w/o injury.  Is to use walker, but leaves it at the entrance to a room.    Neck pain arthritis   Daughter is "the doctor" today, but she has been calling her her husband for several days.  Confusion has been notably worse.  Requires constant supervision.  She is very insecure.  Her daughter had to keep running to the other room when she called her.  Other daughter is to come today to visit 12/18-27.  Has new great grandson.  Sleeping all of the time.  Remains irritable.  Always thinks it is morning if she takes an afternoon nap and then won't get up for dinner.  Has lost 8 more lbs.  Sometimes will eat, sometimes not.    Back painful at times with kyphoscoliosis.    No more constipation problems since changes made last visit.  Insists upon bathing herself in the tub.  Did help her try to get out of the tub.    Review of Systems:  Review of Systems  Constitutional: Positive for weight loss and malaise/fatigue. Negative for fever and chills.  HENT: Positive for hearing loss. Negative for congestion.        Sneezing  Eyes: Negative for pain.  Respiratory: Negative for cough and shortness of breath.   Cardiovascular: Negative for chest pain and leg swelling.  Gastrointestinal: Negative for constipation.  Genitourinary: Negative for dysuria, urgency and frequency.    Musculoskeletal: Positive for back pain and falls.  Skin: Negative for rash.  Neurological: Positive for weakness. Negative for dizziness.  Psychiatric/Behavioral: Positive for depression and memory loss.       Memory declining     Past Medical History  Diagnosis Date  . Coronary artery disease   . High cholesterol   . Vascular dementia with depressed mood   . TIA (transient ischemic attack)   . Arthritis     Past Surgical History  Procedure Laterality Date  . Coronary angioplasty with stent placement      Social History:   reports that she has never smoked. She does not have any smokeless tobacco history on file. She reports that she does not drink alcohol or use illicit drugs.  History reviewed. No pertinent family history.  Medications: Patient's Medications  New Prescriptions   No medications on file  Previous Medications   CALCIUM CARBONATE-VIT D-MIN (CALCIUM 1200) 1200-1000 MG-UNIT CHEW    Chew 1 tablet by mouth daily.    CHOLECALCIFEROL (VITAMIN D) 2000 UNITS TABLET    Take 2,000 Units by mouth at bedtime.    FAMOTIDINE (PEPCID) 20 MG TABLET    Take 20 mg by mouth 2 (two) times daily.   IRON TABS    Take 1  tablet by mouth every morning.    MIRTAZAPINE (REMERON) 15 MG TABLET    Take 15 mg by mouth at bedtime.   MULTIPLE VITAMIN (MULTIVITAMIN WITH MINERALS) TABS    Take 1 tablet by mouth daily.   NITROGLYCERIN (NITRODUR - DOSED IN MG/24 HR) 0.4 MG/HR    Place 1 patch onto the skin daily.   RALOXIFENE (EVISTA) 60 MG TABLET    Take 60 mg by mouth at bedtime.    ROSUVASTATIN (CRESTOR) 10 MG TABLET    Take 10 mg by mouth every evening.    WARFARIN (COUMADIN) 3 MG TABLET    Take 1.5-3 mg by mouth daily at 6 PM. 1.5 mg (half tablet) on Sundays and 3 mg (one tablet) the rest of the week.  Modified Medications   No medications on file  Discontinued Medications   No medications on file     Physical Exam: Filed Vitals:   02/14/13 1058  BP: 104/62  Pulse: 98  Temp:  99.1 F (37.3 C)  TempSrc: Oral  Resp: 12  Weight: 90 lb (40.824 kg)  SpO2: 95%  Physical Exam  Constitutional:  Frail black female seated in wheelchair  Cardiovascular: Normal rate, regular rhythm, normal heart sounds and intact distal pulses.   Pulmonary/Chest: Effort normal and breath sounds normal.  Abdominal: Soft. Bowel sounds are normal. She exhibits no distension. There is no tenderness.  Musculoskeletal: Normal range of motion. She exhibits edema. She exhibits no tenderness.  Neurological: She is alert.  Skin: Skin is warm and dry.  Psychiatric:  Flat affect    Labs reviewed: Basic Metabolic Panel:  Recent Labs  09/81/19 1259 04/06/12 1318 04/12/12 1723 06/19/12 1216  NA 139 141 142 143  K 4.3 4.1 3.9 3.9  CL 103 106 107 104  CO2 24  --  27 26  GLUCOSE 105* 92 102* 91  BUN 12 10 13 11   CREATININE 0.80 0.90 0.74 0.74  CALCIUM 8.6  --  8.5 8.2*   Liver Function Tests:  Recent Labs  03/23/12 1749 04/06/12 1259 04/12/12 1723  AST 21 27 24   ALT 7 10 9   ALKPHOS 53 56 50  BILITOT 0.2* 0.4 0.3  PROT 5.8* 6.2 5.9*  ALBUMIN 2.7* 3.0* 2.8*  CBC:  Recent Labs  04/06/12 1259  04/06/12 1535 04/12/12 1723 06/19/12 1216  WBC 6.6  --  7.6 5.7 7.7  NEUTROABS 4.5  --   --   --  5.7  HGB 10.9*  < > 10.6* 10.1* 9.5*  HCT 33.5*  < > 33.0* 31.7* 30.9*  MCV 87.0  --  87.8 88.1 84  PLT 264  --  245 272  --   < > = values in this interval not displayed. Lipid Panel:  Recent Labs  04/07/12 0735  CHOL 147  HDL 57  LDLCALC 73  TRIG 83  CHOLHDL 2.6   Lab Results  Component Value Date   HGBA1C 5.7* 04/06/2012    Assessment/Plan 1. Vascular dementia with depressed mood -dementia continues to progress--now having difficulty recognizing her daughter  - Prealbumin  2. Anemia, iron deficiency - CBC with Differential  3. Senile osteoporosis -kyphoscoliosis is worsening, cont vitamin d repletion  4. Essential hypertension, benign -bp ok no changes  5.  Coronary artery disease -stable, no chest pains - Comprehensive metabolic panel  6. Hyperglycemia - f/u labs - Comprehensive metabolic panel - Hemoglobin A1c  7. Loss of weight -continues to decline from her dementia - Prealbumin -ideally would  like to get her qualified for hospice care -her daughter definitely wants to keep her at home  Labs/tests ordered:   Orders Placed This Encounter  Procedures  . CBC with Differential  . Comprehensive metabolic panel  . Hemoglobin A1c  . Prealbumin   Next appt:  3 mos

## 2013-02-15 ENCOUNTER — Encounter: Payer: Self-pay | Admitting: *Deleted

## 2013-02-15 LAB — CBC WITH DIFFERENTIAL/PLATELET
Basophils Absolute: 0 10*3/uL (ref 0.0–0.2)
Basos: 1 %
Eos: 1 %
Eosinophils Absolute: 0.1 10*3/uL (ref 0.0–0.4)
HCT: 32 % — ABNORMAL LOW (ref 34.0–46.6)
Hemoglobin: 9.4 g/dL — ABNORMAL LOW (ref 11.1–15.9)
Immature Grans (Abs): 0 10*3/uL (ref 0.0–0.1)
Immature Granulocytes: 0 %
Lymphocytes Absolute: 0.9 10*3/uL (ref 0.7–3.1)
Lymphs: 14 %
MCH: 25.1 pg — ABNORMAL LOW (ref 26.6–33.0)
MCHC: 29.4 g/dL — ABNORMAL LOW (ref 31.5–35.7)
MCV: 86 fL (ref 79–97)
Monocytes Absolute: 0.6 10*3/uL (ref 0.1–0.9)
Monocytes: 9 %
Neutrophils Absolute: 5 10*3/uL (ref 1.4–7.0)
Neutrophils Relative %: 75 %
RBC: 3.74 x10E6/uL — ABNORMAL LOW (ref 3.77–5.28)
RDW: 16.3 % — ABNORMAL HIGH (ref 12.3–15.4)
WBC: 6.6 10*3/uL (ref 3.4–10.8)

## 2013-02-15 LAB — COMPREHENSIVE METABOLIC PANEL
ALT: 7 IU/L (ref 0–32)
AST: 23 IU/L (ref 0–40)
Albumin/Globulin Ratio: 1.6 (ref 1.1–2.5)
Albumin: 3.2 g/dL (ref 3.2–4.6)
Alkaline Phosphatase: 64 IU/L (ref 39–117)
BUN/Creatinine Ratio: 19 (ref 11–26)
BUN: 13 mg/dL (ref 10–36)
CO2: 22 mmol/L (ref 18–29)
Calcium: 8.5 mg/dL — ABNORMAL LOW (ref 8.6–10.2)
Chloride: 105 mmol/L (ref 97–108)
Creatinine, Ser: 0.67 mg/dL (ref 0.57–1.00)
GFR calc Af Amer: 88 mL/min/{1.73_m2} (ref >59)
GFR calc non Af Amer: 77 mL/min/{1.73_m2} (ref >59)
Globulin, Total: 2 g/dL (ref 1.5–4.5)
Glucose: 85 mg/dL (ref 65–99)
Potassium: 4.4 mmol/L (ref 3.5–5.2)
Sodium: 143 mmol/L (ref 134–144)
Total Bilirubin: 0.2 mg/dL (ref 0.0–1.2)
Total Protein: 5.2 g/dL — ABNORMAL LOW (ref 6.0–8.5)

## 2013-02-15 LAB — HEMOGLOBIN A1C
Est. average glucose Bld gHb Est-mCnc: 120 mg/dL
Hgb A1c MFr Bld: 5.8 % — ABNORMAL HIGH (ref 4.8–5.6)

## 2013-02-15 LAB — PREALBUMIN: Prealbumin: 8 mg/dL — ABNORMAL LOW (ref 20–40)

## 2013-02-22 ENCOUNTER — Other Ambulatory Visit: Payer: Self-pay | Admitting: Nurse Practitioner

## 2013-02-25 ENCOUNTER — Other Ambulatory Visit: Payer: Self-pay | Admitting: Nurse Practitioner

## 2013-02-27 ENCOUNTER — Other Ambulatory Visit: Payer: Self-pay | Admitting: Nurse Practitioner

## 2013-05-20 ENCOUNTER — Ambulatory Visit (INDEPENDENT_AMBULATORY_CARE_PROVIDER_SITE_OTHER): Payer: Medicare Other | Admitting: Internal Medicine

## 2013-05-20 ENCOUNTER — Encounter: Payer: Self-pay | Admitting: Internal Medicine

## 2013-05-20 VITALS — BP 104/60 | HR 60 | Temp 97.4°F | Resp 14 | Wt 80.2 lb

## 2013-05-20 DIAGNOSIS — F32A Depression, unspecified: Secondary | ICD-10-CM

## 2013-05-20 DIAGNOSIS — F0151 Vascular dementia with behavioral disturbance: Secondary | ICD-10-CM

## 2013-05-20 DIAGNOSIS — E43 Unspecified severe protein-calorie malnutrition: Secondary | ICD-10-CM

## 2013-05-20 DIAGNOSIS — F329 Major depressive disorder, single episode, unspecified: Principal | ICD-10-CM

## 2013-05-20 DIAGNOSIS — F0153 Vascular dementia, unspecified severity, with mood disturbance: Secondary | ICD-10-CM

## 2013-05-20 DIAGNOSIS — R634 Abnormal weight loss: Secondary | ICD-10-CM

## 2013-05-20 NOTE — Progress Notes (Signed)
Patient ID: Angela Mcintosh, female   DOB: [birth year], 80 y.o.   MRN: [id number]   Location:  [facility] [place] [facility]  Code Status: DNR, hospice care   No Known Allergies  Chief Complaint  Patient presents with  . Medical Managment of Chronic Issues    3 month f/u with no labs prior, and insurance form ([employer])  . Immunizations    declines vaccines  . other    sleeps all the time, swelling the RT hand, swelling in both feet, always cold  . other    not eating much other thatn maple syrup oatmeal, and cut up fruit; states that she can't swollow it.    HPI: Patient is a 80 y.o. female seen in the office today for medical mgt of chronic diseases.  She is here with a different daughter who normally lives in [place].    Legs got swollen sat when went to a funeral and had stockings on.  Left hand is swollen also.    Had an adverse reaction to senna plus--worked, but then she got nauseated from it.    No additional bm now since Friday.  Puts food in mouth, chews it and spits it out.    Sleeps most of the time.  She is not interested in anything.    Review of Systems:  Review of Systems  Constitutional: Positive for weight loss and malaise/fatigue. Negative for fever.  HENT: Negative for congestion.   Eyes: Negative for blurred vision.  Respiratory: Negative for shortness of breath.   Cardiovascular: Positive for leg swelling. Negative for chest pain.  Gastrointestinal: Negative for abdominal pain, blood in stool and melena.       No appetite  Genitourinary: Negative for dysuria.  Musculoskeletal: Negative for falls and myalgias.  Skin: Negative for rash.  Neurological: Positive for weakness. Negative for dizziness and headaches.  Endo/Heme/Allergies: Bruises/bleeds easily.  Psychiatric/Behavioral: Positive for depression and memory loss. Negative for suicidal ideas.     Past Medical History  Diagnosis Date  . Coronary artery disease   . High  cholesterol   . Vascular dementia with depressed mood   . TIA (transient ischemic attack)   . Arthritis     Past Surgical History  Procedure Laterality Date  . Coronary angioplasty with stent placement      Social History:   reports that she has never smoked. She does not have any smokeless tobacco history on file. She reports that she does not drink alcohol or use illicit drugs.  History reviewed. No pertinent family history.  Medications: Patient's Medications  New Prescriptions   No medications on file  Previous Medications   ASPIRIN 325 MG TABLET    Take 325 mg by mouth daily.   CALCIUM CARBONATE-VIT D-MIN (CALCIUM 1200) 1200-1000 MG-UNIT CHEW    Chew 1 tablet by mouth daily.    CHOLECALCIFEROL (VITAMIN D) 2000 UNITS TABLET    Take 2,000 Units by mouth at bedtime.    FAMOTIDINE (PEPCID) 20 MG TABLET    Take 20 mg by mouth 2 (two) times daily.   IRON TABS    Take 1 tablet by mouth every morning.    MIRTAZAPINE (REMERON) 15 MG TABLET    TAKE 1 TABLET BY MOUTH AT BEDTIME   MULTIPLE VITAMIN (MULTIVITAMIN WITH MINERALS) TABS    Take 1 tablet by mouth daily.   NITROGLYCERIN (NITRODUR - DOSED IN MG/24 HR) 0.4 MG/HR    Place 1 patch onto the skin  daily.   NITROGLYCERIN (NITROSTAT) 0.6 MG SL TABLET    Place 0.6 mg under the tongue every 5 (five) minutes as needed for chest pain.   NON FORMULARY    Hydrocodone-Acet 7.5-325  Take 5-10 ml by mouth every 4 hours as needed for pain.   PROCHLORPERAZINE (COMPAZINE) 10 MG TABLET    Take 10 mg by mouth every 6 (six) hours as needed for nausea or vomiting.   SENNA (SENOKOT) 8.6 MG TABS TABLET    Take 1 tablet by mouth daily as needed for mild constipation.  Modified Medications   No medications on file  Discontinued Medications   MIRTAZAPINE (REMERON) 15 MG TABLET    Take 15 mg by mouth at bedtime.   MIRTAZAPINE (REMERON) 15 MG TABLET    TAKE 1 TABLET BY MOUTH AT BEDTIME   MIRTAZAPINE (REMERON) 15 MG TABLET    TAKE 1 TABLET BY MOUTH AT  BEDTIME   RALOXIFENE (EVISTA) 60 MG TABLET    Take 60 mg by mouth at bedtime.    ROSUVASTATIN (CRESTOR) 10 MG TABLET    Take 10 mg by mouth every evening.    WARFARIN (COUMADIN) 3 MG TABLET    Take 1.5-3 mg by mouth daily at 6 PM. 1.5 mg (half tablet) on Sundays and 3 mg (one tablet) the rest of the week.   Physical Exam: Filed Vitals:   05/20/13 1323  BP: 104/60  Pulse: 60  Temp: 97.4 F (36.3 C)  TempSrc: Oral  Resp: 14  Weight: 80 lb 3.2 oz (36.378 kg)   Physical Exam  Constitutional:  Frail black female, NAD seated in wheelchair  HENT:  Head: Normocephalic and atraumatic.  Cardiovascular: Intact distal pulses.   irreg irreg  Pulmonary/Chest: Effort normal and breath sounds normal. No respiratory distress.  Abdominal: Soft. Bowel sounds are normal. She exhibits no distension and no mass. There is no tenderness.  Musculoskeletal:  Too weak to ambulate at this point  Neurological: She is alert.  Oriented to person only  Skin: Skin is warm and dry.  Psychiatric: She has a normal mood and affect.  Still verbal, but speaking less than previous visits and repeating herself more    Labs reviewed: Basic Metabolic Panel:  Recent Labs  16/11/9602/22/14 1216 02/14/13 1155  NA 143 143  K 3.9 4.4  CL 104 105  CO2 26 22  GLUCOSE 91 85  BUN 11 13  CREATININE 0.74 0.67  CALCIUM 8.2* 8.5*   Liver Function Tests:  Recent Labs  02/14/13 1155  AST 23  ALT 7  ALKPHOS 64  BILITOT 0.2  PROT 5.2*  CBC:  Recent Labs  06/19/12 1216 02/14/13 1155  WBC 7.7 6.6  NEUTROABS 5.7 5.0  HGB 9.5* 9.4*  HCT 30.9* 32.0*  MCV 84 86   Lab Results  Component Value Date   HGBA1C 5.8* 02/14/2013   Assessment/Plan 1. Vascular dementia with depressed mood -progressing--is end stage, on hospice care at this time -she has extremely short term memory loss during today's visit, continues to decline with weight loss and poor po intake -continues with urinary incontinence, has PAD and is at  risk of falls -remains depressed but no SI, is on mirtazapine for that -no longer on coumadin for prior stroke, TIAs due to goals of care -has ntg for chest pain symptoms as goal is good qol with symptom relief  2. Protein-calorie malnutrition, severe -as evidenced by her edema of her legs and left arm -discussed with her daugther  today  -we talked about adding more protein to her diet including protein powder or adding peanut butter and milk  -pt primarily likes warm liquids so we are more limited with protein shakes that way and smoothies -she is not a fan of soup -cont with oatmeal  3. Loss of weight -as in #2--due to her dementia  Labs/tests ordered:  none Next appt:  As needed

## 2013-05-20 NOTE — Patient Instructions (Signed)
Encourage high protein foods Try protein powder in drinks Peanut butter, milk, beans have a lot of protein

## 2013-07-03 ENCOUNTER — Telehealth: Payer: Self-pay | Admitting: *Deleted

## 2013-07-03 NOTE — Telephone Encounter (Signed)
[facility] and per Dr. Chilton SiGreen patient to start Septra DS #20 One tablet twice daily for infection. [place] and [place] Clydie [place]aren, [place] and she stated that the Hospice Dr. Already called in an antibiotic and patient has already started taking it. Told her to continue this and call with any concerns. Agreed.

## 2013-07-29 DEATH — deceased
